# Patient Record
Sex: Female | Born: 1935 | Race: White | Hispanic: No | Marital: Married | Smoking: Never smoker
Health system: Southern US, Community
[De-identification: ages and names within clinical notes are randomized; demographics above are authoritative.]

## PROBLEM LIST (undated history)

## (undated) DIAGNOSIS — K2270 Barrett's esophagus without dysplasia: Secondary | ICD-10-CM

## (undated) DIAGNOSIS — E039 Hypothyroidism, unspecified: Secondary | ICD-10-CM

## (undated) DIAGNOSIS — J8410 Pulmonary fibrosis, unspecified: Secondary | ICD-10-CM

## (undated) DIAGNOSIS — J45909 Unspecified asthma, uncomplicated: Secondary | ICD-10-CM

## (undated) DIAGNOSIS — E785 Hyperlipidemia, unspecified: Secondary | ICD-10-CM

## (undated) DIAGNOSIS — K589 Irritable bowel syndrome without diarrhea: Secondary | ICD-10-CM

## (undated) HISTORY — PX: GASTROSTOMY W/ FEEDING TUBE: SUR642

## (undated) HISTORY — PX: HERNIA REPAIR: SHX51

## (undated) HISTORY — PX: NISSEN FUNDOPLICATION: SHX2091

---
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
## 1975-10-12 HISTORY — PX: ABDOMINAL HYSTERECTOMY: SHX81

## 2016-02-28 ENCOUNTER — Encounter (HOSPITAL_BASED_OUTPATIENT_CLINIC_OR_DEPARTMENT_OTHER): Payer: Self-pay | Admitting: Emergency Medicine

## 2016-02-28 ENCOUNTER — Emergency Department (HOSPITAL_BASED_OUTPATIENT_CLINIC_OR_DEPARTMENT_OTHER): Payer: Medicare Other

## 2016-02-28 ENCOUNTER — Emergency Department (HOSPITAL_BASED_OUTPATIENT_CLINIC_OR_DEPARTMENT_OTHER)
Admission: EM | Admit: 2016-02-28 | Discharge: 2016-02-28 | Disposition: A | Discharge status: Home / Self Care | Payer: Medicare Other | Attending: Emergency Medicine | Admitting: Emergency Medicine

## 2016-02-28 DIAGNOSIS — E039 Hypothyroidism, unspecified: Secondary | ICD-10-CM | POA: Insufficient documentation

## 2016-02-28 DIAGNOSIS — J45909 Unspecified asthma, uncomplicated: Secondary | ICD-10-CM | POA: Insufficient documentation

## 2016-02-28 DIAGNOSIS — K5901 Slow transit constipation: Secondary | ICD-10-CM | POA: Diagnosis not present

## 2016-02-28 DIAGNOSIS — E785 Hyperlipidemia, unspecified: Secondary | ICD-10-CM | POA: Insufficient documentation

## 2016-02-28 DIAGNOSIS — Z79899 Other long term (current) drug therapy: Secondary | ICD-10-CM | POA: Diagnosis not present

## 2016-02-28 DIAGNOSIS — K5900 Constipation, unspecified: Secondary | ICD-10-CM | POA: Diagnosis present

## 2016-02-28 HISTORY — DX: Irritable bowel syndrome without diarrhea: K58.9

## 2016-02-28 HISTORY — DX: Unspecified asthma, uncomplicated: J45.909

## 2016-02-28 HISTORY — DX: Hypothyroidism, unspecified: E03.9

## 2016-02-28 HISTORY — DX: Hyperlipidemia, unspecified: E78.5

## 2016-02-28 HISTORY — DX: Pulmonary fibrosis, unspecified: J84.10

## 2016-02-28 HISTORY — DX: Barrett's esophagus without dysplasia: K22.70

## 2016-02-28 LAB — OCCULT BLOOD X 1 CARD TO LAB, STOOL: FECAL OCCULT BLD: POSITIVE — AB

## 2016-02-28 MED ORDER — DICYCLOMINE HCL 20 MG PO TABS: 20.0000 mg | ORAL_TABLET | Freq: Two times a day (BID) | ORAL | Status: AC

## 2016-02-28 MED ORDER — DICYCLOMINE HCL 10 MG PO CAPS
20.0000 mg | ORAL_CAPSULE | Freq: Once | ORAL | Status: AC
  Administered 2016-02-28: 20 mg via ORAL
  Filled 2016-02-28: qty 2

## 2016-02-28 MED ORDER — LACTULOSE 10 GM/15ML PO SOLN
20.0000 g | Freq: Two times a day (BID) | ORAL | Status: AC | PRN
Start: 2016-02-28 — End: ?

## 2016-02-28 MED ORDER — ONDANSETRON 8 MG PO TBDP
8.0000 mg | ORAL_TABLET | Freq: Once | ORAL | Status: AC
  Administered 2016-02-28: 8 mg via ORAL
  Filled 2016-02-28: qty 1

## 2016-02-28 MED ORDER — MINERAL OIL RE ENEM
1.0000 | ENEMA | Freq: Once | RECTAL | Status: AC
  Administered 2016-02-28: 1 via RECTAL
  Filled 2016-02-28: qty 1

## 2016-02-28 MED ORDER — FLEET ENEMA 7-19 GM/118ML RE ENEM
1.0000 | ENEMA | Freq: Once | RECTAL | Status: AC
  Administered 2016-02-28: 1 via RECTAL
  Filled 2016-02-28: qty 1

## 2016-02-28 MED ORDER — PROCHLORPERAZINE EDISYLATE 5 MG/ML IJ SOLN
10.0000 mg | Freq: Once | INTRAMUSCULAR | Status: AC
  Administered 2016-02-28: 10 mg via INTRAMUSCULAR
  Filled 2016-02-28: qty 2

## 2016-02-28 NOTE — ED Notes (Signed)
Called pt for triage but she had to use the bathroom first.

## 2016-02-28 NOTE — ED Notes (Signed)
[facility] with low abd pain and cramping. Currently being treated for UTI

## 2016-02-28 NOTE — ED Notes (Signed)
Pt given d/c instructions as per chart. Verbalizes understanding. No questions. Rx x 2. 

## 2016-02-28 NOTE — ED Notes (Signed)
Pt vomiting and nauseated, MD notified. Unable to have BM

## 2016-02-28 NOTE — Discharge Instructions (Signed)
Constipation, Adult °Constipation is when a person: °· Poops (has a bowel movement) less than 3 times a week. °· Has a hard time pooping. °· Has poop that is dry, hard, or bigger than normal. °HOME CARE  °· Eat foods with a lot of fiber in them. This includes fruits, vegetables, beans, and whole grains such as brown rice. °· Avoid fatty foods and foods with a lot of sugar. This includes french fries, hamburgers, cookies, candy, and soda. °· If you are not getting enough fiber from food, take products with added fiber in them (supplements). °· Drink enough fluid to keep your pee (urine) clear or pale yellow. °· Exercise on a regular basis, or as told by your doctor. °· Go to the restroom when you feel like you need to poop. Do not hold it. °· Only take medicine as told by your doctor. Do not take medicines that help you poop (laxatives) without talking to your doctor first. °GET HELP RIGHT AWAY IF:  °· You have bright red blood in your poop (stool). °· Your constipation lasts more than 4 days or gets worse. °· You have belly (abdominal) or butt (rectal) pain. °· You have thin poop (as thin as a pencil). °· You lose weight, and it cannot be explained. °MAKE SURE YOU:  °· Understand these instructions. °· Will watch your condition. °· Will get help right away if you are not doing well or get worse. °  °This information is not intended to replace advice given to you by your health care provider. Make sure you discuss any questions you have with your health care provider. °  °Document Released: 03/15/2008 Document Revised: 10/18/2014 Document Reviewed: 07/09/2013 °Elsevier Interactive Patient Education ©2016 [employer] ° °High-Fiber Diet °Fiber, also called dietary fiber, is a type of carbohydrate found in fruits, vegetables, whole grains, and beans. A high-fiber diet can have many health benefits. Your health care provider may recommend a high-fiber diet to help: °· Prevent constipation. Fiber can make your bowel  movements more regular. °· Lower your cholesterol. °· Relieve hemorrhoids, uncomplicated diverticulosis, or irritable bowel syndrome. °· Prevent overeating as part of a weight-loss plan. °· Prevent heart disease, type 2 diabetes, and certain cancers. °WHAT IS MY PLAN? °The recommended daily intake of fiber includes: °· 38 grams for men under age 50. °· 30 grams for men over age 50. °· 25 grams for women under age 50. °· 21 grams for women over age 50. °You can get the recommended daily intake of dietary fiber by eating a variety of fruits, vegetables, grains, and beans. Your health care provider may also recommend a fiber supplement if it is not possible to get enough fiber through your diet. °WHAT DO I NEED TO KNOW ABOUT A HIGH-FIBER DIET? °· Fiber supplements have not been widely studied for their effectiveness, so it is better to get fiber through food sources. °· Always check the fiber content on the nutrition facts label of any prepackaged food. Look for foods that contain at least 5 grams of fiber per serving. °· Ask your dietitian if you have questions about specific foods that are related to your condition, especially if those foods are not listed in the following section. °· Increase your daily fiber consumption gradually. Increasing your intake of dietary fiber too quickly may cause bloating, cramping, or gas. °· Drink plenty of water. Water helps you to digest fiber. °WHAT FOODS CAN I EAT? °Grains °Whole-grain breads. Multigrain cereal. Oats and oatmeal. Brown rice. Barley.   Bulgur wheat. Millet. Bran muffins. Popcorn. Rye wafer crackers. °Vegetables °Sweet potatoes. Spinach. Kale. Artichokes. Cabbage. Broccoli. Green peas. Carrots. Squash. °Fruits °Berries. Pears. Apples. Oranges. Avocados. Prunes and raisins. Dried figs. °Meats and Other Protein Sources °Navy, kidney, pinto, and soy beans. Split peas. Lentils. Nuts and seeds. °Dairy °Fiber-fortified yogurt. °Beverages °Fiber-fortified soy milk.  Fiber-fortified orange juice. °Other °Fiber bars. °The items listed above may not be a complete list of recommended foods or beverages. Contact your dietitian for more options. °WHAT FOODS ARE NOT RECOMMENDED? °Grains °White bread. Pasta made with refined flour. White rice. °Vegetables °Fried potatoes. Canned vegetables. Well-cooked vegetables.  °Fruits °Fruit juice. Cooked, strained fruit. °Meats and Other Protein Sources °Fatty cuts of meat. Fried poultry or fried fish. °Dairy °Milk. Yogurt. Cream cheese. Sour cream. °Beverages °Soft drinks. °Other °Cakes and pastries. Butter and oils. °The items listed above may not be a complete list of foods and beverages to avoid. Contact your dietitian for more information. °WHAT ARE SOME TIPS FOR INCLUDING HIGH-FIBER FOODS IN MY DIET? °· Eat a wide variety of high-fiber foods. °· Make sure that half of all grains consumed each day are whole grains. °· Replace breads and cereals made from refined flour or white flour with whole-grain breads and cereals. °· Replace white rice with brown rice, bulgur wheat, or millet. °· Start the day with a breakfast that is high in fiber, such as a cereal that contains at least 5 grams of fiber per serving. °· Use beans in place of meat in soups, salads, or pasta. °· Eat high-fiber snacks, such as berries, raw vegetables, nuts, or popcorn. °  °This information is not intended to replace advice given to you by your health care provider. Make sure you discuss any questions you have with your health care provider. °  °Document Released: 09/27/2005 Document Revised: 10/18/2014 Document Reviewed: 03/12/2014 °Elsevier Interactive Patient Education ©2016 Elsevier Inc. ° °

## 2016-02-28 NOTE — ED Notes (Signed)
Pt up to bedside commode. Unable to have BM, c/o nausea, MD notified.

## 2016-02-28 NOTE — ED Notes (Signed)
Pt tolerated 2nd enema well. Instructed to try to hold it in for 15 min.

## 2016-02-28 NOTE — ED Notes (Signed)
Pt tolerated enema well. Bedside commode to bedside. Pt instructed to hold enema in for several minutes if possible.

## 2016-02-28 NOTE — ED Notes (Signed)
Pt states last BM 5/17. Has tried stool softeners and miralax without relief. Pt c/o lower abd pain.

## 2016-02-28 NOTE — ED Notes (Signed)
MD at bedside. 

## 2016-02-28 NOTE — ED Provider Notes (Signed)
CSN: [id number]     Arrival date & time 02/28/16  1555 History  By signing my name below, I, Linna DarnerRussell Turner, attest that this documentation has been prepared under the direction and in the presence of physician practitioner, Jacalyn LefevreJulie Shanaia Sievers, MD. Electronically Signed: Linna Darnerussell Turner, Scribe. 02/28/2016. 4:36 PM.   Chief Complaint  Patient presents with  . Constipation    The history is provided by the patient and a relative. No language interpreter was used.     HPI Comments: Haley Rangel is a 82 y.o. female with h/o IBS who presents to the Emergency Department complaining of constant constipation beginning three days ago. Pt reports associated abdominal pain since onset as well; she rates her abdominal pain as 2/10 currently. She states that she has felt the need to have a bowel movement since onset but has been unable to have one. Pt has no h/o abdominal surgery or impaction. She states that her last bowel movement was three days ago. Pt took an enema yesterday with no relief. Pt reports that she is currently being treated for a UTI and her medications were recently changed; she expresses concern that her medication change could be causing her symptoms. Pt denies dysuria, rectal pain, or any other associated symptoms.  Past Medical History  Diagnosis Date  . Asthma   . Barrett esophagus   . IBS (irritable bowel syndrome)   . Pulmonary fibrosis (HCC)   . HLD (hyperlipidemia)   . Hypothyroidism    Past Surgical History  Procedure Laterality Date  . Appendectomy  1953  . Abdominal hysterectomy  1977   History reviewed. No pertinent family history. Social History  Substance Use Topics  . Smoking status: Never Smoker   . Smokeless tobacco: None  . Alcohol Use: No   OB History    No data available     Review of Systems  Gastrointestinal: Positive for abdominal pain and constipation. Negative for rectal pain.  Genitourinary: Negative for dysuria.  All other systems reviewed and are  negative.   Allergies  Macrobid; Percocet; Pneumococcal vaccines; and Prednisone  Home Medications   Prior to Admission medications   Medication Sig Start Date End Date Taking? Authorizing Provider  calcium carbonate (OSCAL) 1500 (600 Ca) MG TABS tablet Take 600 mg of elemental calcium by mouth 2 (two) times daily with a meal.   Yes Historical Provider, MD  cholecalciferol (VITAMIN D) 1000 units tablet Take 1,000 Units by mouth daily.   Yes Historical Provider, MD  dicyclomine (BENTYL) 20 MG tablet Take 1 tablet (20 mg total) by mouth 2 (two) times daily. 02/28/16   Jacalyn LefevreJulie Timiko Offutt, MD  fexofenadine (ALLEGRA) 30 MG tablet Take 30 mg by mouth 2 (two) times daily.   Yes Historical Provider, MD  HYDROcodone-acetaminophen (NORCO/VICODIN) 5-325 MG tablet Take 1 tablet by mouth every 6 (six) hours as needed for moderate pain.   Yes Historical Provider, MD  lactulose (CHRONULAC) 10 GM/15ML solution Take 30 mLs (20 g total) by mouth 2 (two) times daily as needed for mild constipation or severe constipation. 02/28/16   Jacalyn LefevreJulie Mahmoud Blazejewski, MD  levothyroxine (SYNTHROID, LEVOTHROID) 112 MCG tablet Take 112 mcg by mouth daily before breakfast.   Yes Historical Provider, MD  omeprazole (PRILOSEC) 20 MG capsule Take 20 mg by mouth daily.   Yes Historical Provider, MD  sertraline (ZOLOFT) 100 MG tablet Take 100 mg by mouth daily.   Yes Historical Provider, MD  simvastatin (ZOCOR) 40 MG tablet Take 40 mg by mouth daily.  Yes Historical Provider, MD   BP 107/73 mmHg  Pulse 82  Temp(Src) 98.3 F (36.8 C) (Oral)  Resp 20  Ht  (1.549 m)  Wt 148 lb (67.132 kg)  BMI 27.98 kg/m2  SpO2 93% Physical Exam  Constitutional: She is oriented to person, place, and time. She appears well-developed and well-nourished. No distress.  HENT:  Head: Normocephalic and atraumatic.  Eyes: Conjunctivae and EOM are normal.  Neck: Neck supple. No tracheal deviation present.  Cardiovascular: Normal rate.   Pulmonary/Chest:  Effort normal. No respiratory distress.  Musculoskeletal: Normal range of motion.  Neurological: She is alert and oriented to person, place, and time.  Skin: Skin is warm and dry.  Psychiatric: She has a normal mood and affect. Her behavior is normal.  Nursing note and vitals reviewed.   ED Course  Procedures (including critical care time)  DIAGNOSTIC STUDIES: Oxygen Saturation is 99% on RA, normal by my interpretation.    COORDINATION OF CARE: 4:41 PM Discussed treatment plan with pt at bedside and pt agreed to plan.  Labs Review Labs Reviewed  OCCULT BLOOD X 1 CARD TO LAB, STOOL - Abnormal; Notable for the following:    Fecal Occult Bld POSITIVE (*)    All other components within normal limits    Imaging Review Dg Abd Acute W/chest  02/28/2016  CLINICAL DATA:  Constipation EXAM: DG ABDOMEN ACUTE W/ 1V CHEST COMPARISON:  None. FINDINGS: Chronic interstitial markings with subpleural reticulation/ fibrosis. No focal consolidation. No pleural effusion or pneumothorax. The heart is normal in size. Nonobstructive bowel gas pattern. Mild to moderate left colonic stool burden. Degenerative changes the visualized thoracolumbar spine with lumbar dextroscoliosis. IMPRESSION: No evidence of acute cardiopulmonary disease. No evidence of small bowel obstruction or free air. Mild to moderate left colonic stool burden. Electronically Signed   By: Charline Bills M.D.   On: 02/28/2016 17:53   I have personally reviewed and evaluated these images and lab results as part of my medical decision-making.   EKG Interpretation None      MDM  PT HAS HAD 3 ENEMAS WITH MINIMAL RESULT.  PT WILL BE D/C'D HOME WITH LACTULOSE.  IF THAT DOES NOT WORK, SHE IS INSTR TO RETURN HERE FOR [facility].  SHE WILL ALSO BE GIVEN BENTYL TO HELP WITH THE CRAMPING. Final diagnoses:  Slow transit constipation    I personally performed the services described in this documentation, which was scribed in my presence. The  recorded information has been reviewed and is accurate.   Jacalyn Lefevre, MD 02/28/16 2046

## 2016-02-28 NOTE — ED Notes (Signed)
Pt alert, NAD,calm, interactive, resps e/u, speaking in clear complete sentences, resps e/u, no dyspnea noted, "no results with enemas x3, reports some intestinal/ abd cramping, denies other sx at this time. [facility].

## 2016-02-29 ENCOUNTER — Encounter (HOSPITAL_BASED_OUTPATIENT_CLINIC_OR_DEPARTMENT_OTHER): Payer: Self-pay | Admitting: Emergency Medicine

## 2016-02-29 ENCOUNTER — Emergency Department (HOSPITAL_BASED_OUTPATIENT_CLINIC_OR_DEPARTMENT_OTHER)
Admission: EM | Admit: 2016-02-29 | Discharge: 2016-02-29 | Disposition: A | Discharge status: Home / Self Care | Payer: Medicare Other | Attending: Emergency Medicine | Admitting: Emergency Medicine

## 2016-02-29 ENCOUNTER — Emergency Department (HOSPITAL_BASED_OUTPATIENT_CLINIC_OR_DEPARTMENT_OTHER): Payer: Medicare Other

## 2016-02-29 DIAGNOSIS — R112 Nausea with vomiting, unspecified: Secondary | ICD-10-CM | POA: Insufficient documentation

## 2016-02-29 DIAGNOSIS — K5900 Constipation, unspecified: Secondary | ICD-10-CM | POA: Insufficient documentation

## 2016-02-29 DIAGNOSIS — E039 Hypothyroidism, unspecified: Secondary | ICD-10-CM | POA: Diagnosis not present

## 2016-02-29 DIAGNOSIS — Z90710 Acquired absence of both cervix and uterus: Secondary | ICD-10-CM | POA: Diagnosis not present

## 2016-02-29 DIAGNOSIS — J45909 Unspecified asthma, uncomplicated: Secondary | ICD-10-CM | POA: Insufficient documentation

## 2016-02-29 DIAGNOSIS — R109 Unspecified abdominal pain: Secondary | ICD-10-CM | POA: Diagnosis present

## 2016-02-29 DIAGNOSIS — E785 Hyperlipidemia, unspecified: Secondary | ICD-10-CM | POA: Diagnosis not present

## 2016-02-29 LAB — URINALYSIS, ROUTINE W REFLEX MICROSCOPIC
BILIRUBIN URINE: NEGATIVE
Glucose, UA: NEGATIVE mg/dL
HGB URINE DIPSTICK: NEGATIVE
KETONES UR: 15 mg/dL — AB
Leukocytes, UA: NEGATIVE
Nitrite: POSITIVE — AB
PROTEIN: NEGATIVE mg/dL
Specific Gravity, Urine: >1.046 — ABNORMAL HIGH (ref 1.005–1.030)
pH: 6.5 (ref 5.0–8.0)

## 2016-02-29 LAB — COMPREHENSIVE METABOLIC PANEL
ALK PHOS: 67 U/L (ref 38–126)
ALT: 18 U/L (ref 14–54)
AST: 29 U/L (ref 15–41)
Albumin: 4.2 g/dL (ref 3.5–5.0)
Anion gap: 11 (ref 5–15)
BUN: 11 mg/dL (ref 6–20)
CALCIUM: 9.4 mg/dL (ref 8.9–10.3)
CHLORIDE: 102 mmol/L (ref 101–111)
CO2: 24 mmol/L (ref 22–32)
CREATININE: 0.72 mg/dL (ref 0.44–1.00)
Glucose, Bld: 157 mg/dL — ABNORMAL HIGH (ref 65–99)
Potassium: 3.2 mmol/L — ABNORMAL LOW (ref 3.5–5.1)
Sodium: 137 mmol/L (ref 135–145)
TOTAL PROTEIN: 7.7 g/dL (ref 6.5–8.1)
Total Bilirubin: 0.9 mg/dL (ref 0.3–1.2)

## 2016-02-29 LAB — I-STAT CG4 LACTIC ACID, ED: LACTIC ACID, VENOUS: 1.47 mmol/L (ref 0.5–2.0)

## 2016-02-29 LAB — CBC WITH DIFFERENTIAL/PLATELET
Basophils Absolute: 0 10*3/uL (ref 0.0–0.1)
Basophils Relative: 0 %
EOS PCT: 0 %
Eosinophils Absolute: 0 10*3/uL (ref 0.0–0.7)
HCT: 35.8 % — ABNORMAL LOW (ref 36.0–46.0)
Hemoglobin: 11.8 g/dL — ABNORMAL LOW (ref 12.0–15.0)
LYMPHS ABS: 0.7 10*3/uL (ref 0.7–4.0)
LYMPHS PCT: 6 %
MCH: 28.2 pg (ref 26.0–34.0)
MCHC: 33 g/dL (ref 30.0–36.0)
MCV: 85.4 fL (ref 78.0–100.0)
Monocytes Absolute: 0.3 10*3/uL (ref 0.1–1.0)
Monocytes Relative: 3 %
Neutro Abs: 9.4 10*3/uL — ABNORMAL HIGH (ref 1.7–7.7)
Neutrophils Relative %: 91 %
PLATELETS: 231 10*3/uL (ref 150–400)
RBC: 4.19 MIL/uL (ref 3.87–5.11)
RDW: 14.2 % (ref 11.5–15.5)
WBC: 10.4 10*3/uL (ref 4.0–10.5)

## 2016-02-29 LAB — URINE MICROSCOPIC-ADD ON

## 2016-02-29 LAB — LIPASE, BLOOD: LIPASE: 18 U/L (ref 11–51)

## 2016-02-29 MED ORDER — IOPAMIDOL (ISOVUE-300) INJECTION 61%
100.0000 mL | Freq: Once | INTRAVENOUS | Status: AC | PRN
  Administered 2016-02-29: 100 mL via INTRAVENOUS

## 2016-02-29 MED ORDER — SODIUM CHLORIDE 0.9 % IV BOLUS (SEPSIS)
500.0000 mL | Freq: Once | INTRAVENOUS | Status: AC
  Administered 2016-02-29: 500 mL via INTRAVENOUS

## 2016-02-29 MED ORDER — ONDANSETRON HCL 4 MG/2ML IJ SOLN
4.0000 mg | Freq: Once | INTRAMUSCULAR | Status: AC | PRN
  Administered 2016-02-29: 4 mg via INTRAVENOUS

## 2016-02-29 MED ORDER — ONDANSETRON HCL 4 MG/2ML IJ SOLN
4.0000 mg | Freq: Once | INTRAMUSCULAR | Status: AC
  Administered 2016-02-29: 4 mg via INTRAVENOUS
  Filled 2016-02-29: qty 2

## 2016-02-29 MED ORDER — DIPHENHYDRAMINE HCL 50 MG/ML IJ SOLN
12.5000 mg | Freq: Once | INTRAMUSCULAR | Status: AC
  Administered 2016-02-29: 12.5 mg via INTRAVENOUS
  Filled 2016-02-29: qty 1

## 2016-02-29 MED ORDER — SODIUM CHLORIDE 0.9 % IV SOLN
Freq: Once | INTRAVENOUS | Status: AC
  Administered 2016-02-29: 09:00:00 via INTRAVENOUS

## 2016-02-29 MED ORDER — ONDANSETRON HCL 4 MG/2ML IJ SOLN
INTRAMUSCULAR | Status: AC
  Filled 2016-02-29: qty 2

## 2016-02-29 MED ORDER — PROCHLORPERAZINE EDISYLATE 5 MG/ML IJ SOLN
5.0000 mg | Freq: Once | INTRAMUSCULAR | Status: AC
  Administered 2016-02-29: 5 mg via INTRAVENOUS
  Filled 2016-02-29: qty 2

## 2016-02-29 NOTE — ED Notes (Addendum)
Pt tx here yesterday for constipation. Sent home with bentyl and lactulose. Pt in today with ongoing lower abd pain with N/V. Pt reports very small, hard BM last night.

## 2016-02-29 NOTE — ED Notes (Signed)
MD at bedside. 

## 2016-02-29 NOTE — ED Notes (Signed)
Pt actively vomiting during assessment

## 2016-02-29 NOTE — ED Provider Notes (Addendum)
CSN: [id number]     Arrival date & time 02/29/16  [id number] History   First MD Initiated Contact with Patient 02/29/16 [phone]     Chief Complaint  Patient presents with  . Abdominal Pain     HPI  Patient presents for evaluation of abdominal pain. Seen here yesterday with a complaint of constipation and reported history of irritable bowel syndrome. States she occasionally will have hard stools but has not been plagued with constipation as a significant symptom for her in the past. Had been 2 days since a bowel movement yesterday. Was given lactulose and Bentyl. Had a small bowel movement last night no relief awakened this morning with continuing pain and now some nausea and vomiting. Crampy and intermittent symptoms. No fevers or chills. No blood. No dysuria frequency. Past surgical history includes appendectomy, total abdominal hysterectomy.  Patient, and daughter report normal colonoscopy in the last 10 years  Past Medical History  Diagnosis Date  . Asthma   . Barrett esophagus   . IBS (irritable bowel syndrome)   . Pulmonary fibrosis (HCC)   . HLD (hyperlipidemia)   . Hypothyroidism    Past Surgical History  Procedure Laterality Date  . Appendectomy  1953  . Abdominal hysterectomy  1977   No family history on file. Social History  Substance Use Topics  . Smoking status: Never Smoker   . Smokeless tobacco: None  . Alcohol Use: No   OB History    No data available     Review of Systems  Constitutional: Negative for fever, chills, diaphoresis, appetite change and fatigue.  HENT: Negative for mouth sores, sore throat and trouble swallowing.   Eyes: Negative for visual disturbance.  Respiratory: Negative for cough, chest tightness, shortness of breath and wheezing.   Cardiovascular: Negative for chest pain.  Gastrointestinal: Positive for nausea, vomiting, abdominal pain, constipation and abdominal distention. Negative for diarrhea.  Endocrine: Negative for polydipsia, polyphagia  and polyuria.  Genitourinary: Negative for dysuria, frequency and hematuria.  Musculoskeletal: Negative for gait problem.  Skin: Negative for color change, pallor and rash.  Neurological: Negative for dizziness, syncope, light-headedness and headaches.  Hematological: Does not bruise/bleed easily.  Psychiatric/Behavioral: Negative for behavioral problems and confusion.      Allergies  Macrobid; Percocet; Pneumococcal vaccines; and Prednisone  Home Medications   Prior to Admission medications   Medication Sig Start Date End Date Taking? Authorizing Provider  calcium carbonate (OSCAL) 1500 (600 Ca) MG TABS tablet Take 600 mg of elemental calcium by mouth 2 (two) times daily with a meal.    Historical Provider, MD  cholecalciferol (VITAMIN D) 1000 units tablet Take 1,000 Units by mouth daily.    Historical Provider, MD  dicyclomine (BENTYL) 20 MG tablet Take 1 tablet (20 mg total) by mouth 2 (two) times daily. 02/28/16   Jacalyn LefevreJulie Haviland, MD  fexofenadine (ALLEGRA) 30 MG tablet Take 30 mg by mouth 2 (two) times daily.    Historical Provider, MD  HYDROcodone-acetaminophen (NORCO/VICODIN) 5-325 MG tablet Take 1 tablet by mouth every 6 (six) hours as needed for moderate pain.    Historical Provider, MD  lactulose (CHRONULAC) 10 GM/15ML solution Take 30 mLs (20 g total) by mouth 2 (two) times daily as needed for mild constipation or severe constipation. 02/28/16   Jacalyn LefevreJulie Haviland, MD  levothyroxine (SYNTHROID, LEVOTHROID) 112 MCG tablet Take 112 mcg by mouth daily before breakfast.    Historical Provider, MD  omeprazole (PRILOSEC) 20 MG capsule Take 20 mg by mouth daily.  Historical Provider, MD  sertraline (ZOLOFT) 100 MG tablet Take 100 mg by mouth daily.    Historical Provider, MD  simvastatin (ZOCOR) 40 MG tablet Take 40 mg by mouth daily.    Historical Provider, MD   BP 149/80 mmHg  Pulse 83  Temp(Src) 97.9 F (36.6 C) (Oral)  Resp 17  SpO2 98% Physical Exam  Constitutional: She is  oriented to person, place, and time. She appears well-developed and well-nourished. No distress.  A elderly female. Awake. Quietly moaning. Holds her hand across her bilateral lower abdomen.  HENT:  Head: Normocephalic.  Eyes: Conjunctivae are normal. Pupils are equal, round, and reactive to light. No scleral icterus.  Neck: Normal range of motion. Neck supple. No thyromegaly present.  Cardiovascular: Normal rate and regular rhythm.  Exam reveals no gallop and no friction rub.   No murmur heard. Pulmonary/Chest: Effort normal and breath sounds normal. No respiratory distress. She has no wheezes. She has no rales.  Abdominal: Soft. Bowel sounds are normal. She exhibits no distension. There is no tenderness. There is no rebound.    Musculoskeletal: Normal range of motion.  Neurological: She is alert and oriented to person, place, and time.  Skin: Skin is warm and dry. No rash noted.  Psychiatric: She has a normal mood and affect. Her behavior is normal.    ED Course  Procedures (including critical care time) Labs Review Labs Reviewed  CBC WITH DIFFERENTIAL/PLATELET - Abnormal; Notable for the following:    Hemoglobin 11.8 (*)    HCT 35.8 (*)    Neutro Abs 9.4 (*)    All other components within normal limits  COMPREHENSIVE METABOLIC PANEL - Abnormal; Notable for the following:    Potassium 3.2 (*)    Glucose, Bld 157 (*)    All other components within normal limits  URINALYSIS, ROUTINE W REFLEX MICROSCOPIC (NOT AT [facility]) - Abnormal; Notable for the following:    Color, Urine ORANGE (*)    Specific Gravity, Urine >1.046 (*)    Ketones, ur 15 (*)    Nitrite POSITIVE (*)    All other components within normal limits  URINE MICROSCOPIC-ADD ON - Abnormal; Notable for the following:    Squamous Epithelial / LPF 0-5 (*)    Bacteria, UA RARE (*)    All other components within normal limits  LIPASE, BLOOD  I-STAT CG4 LACTIC ACID, ED    Imaging Review Ct Abdomen Pelvis W  Contrast  02/29/2016  CLINICAL DATA:  Abdominal pain. Constipation. Nausea and vomiting. History of appendectomy and hysterectomy. EXAM: CT ABDOMEN AND PELVIS WITH CONTRAST TECHNIQUE: Multidetector CT imaging of the abdomen and pelvis was performed using the standard protocol following bolus administration of intravenous contrast. CONTRAST:  ISOVUE-300 IOPAMIDOL (ISOVUE-300) INJECTION 61% COMPARISON:  Abdominal radiographs from 1 day prior. FINDINGS: Lower chest: Patchy subpleural reticulation at both lung bases. Coronary atherosclerosis. Hepatobiliary: Normal liver size. There are 3 scattered subcentimeter hypodense lesions throughout the liver, too small to characterize. Layering 6 mm calcified gallstone in the gallbladder, with no gallbladder wall thickening or pericholecystic fluid. No biliary ductal dilatation. Pancreas: Normal, with no mass or duct dilation. Spleen: Normal size. No mass. Adrenals/Urinary Tract: Normal adrenals. No hydronephrosis. Small simple parapelvic renal cysts in the left renal sinus. There a few tiny subcentimeter hypodense renal cortical lesions in both kidneys, too small to characterize. Normal bladder. Stomach/Bowel: Moderate to large hiatal hernia. No definite gastric wall thickening or significant gastric distention. Normal caliber small bowel with no small  bowel wall thickening. Appendectomy. There is moderate sigmoid diverticulosis. There is uniform mild wall thickening throughout the sigmoid colon. No significant pericolonic fat stranding. There is mild-to-moderate stool in the distal colon with liquid stool and fluid levels throughout the mildly distended proximal colon. Vascular/Lymphatic: Atherosclerotic nonaneurysmal abdominal aorta. Patent portal, splenic, hepatic and renal veins. No pathologically enlarged lymph nodes in the abdomen or pelvis. Reproductive: Status post hysterectomy, with no abnormal findings at the vaginal cuff. No adnexal mass. Other: No  pneumoperitoneum, ascites or focal fluid collection. Musculoskeletal: No aggressive appearing focal osseous lesions. Moderate degenerative changes in the visualized thoracolumbar spine. Small fat containing right inguinal hernia. IMPRESSION: 1. Moderate sigmoid diverticulosis. Uniform wall thickening throughout the sigmoid colon without significant pericolonic fat stranding, favor muscular hypertrophy due to chronic diverticulosis. 2. Mild to moderate stool in the distal colon with liquid stool and fluid levels in the mildly distended proximal colon. No bowel obstruction. 3. Moderate to large hiatal hernia. 4. Nonspecific patchy subpleural reticulation at both lung bases, cannot exclude an interstitial lung disease. 5. Cholelithiasis. 6. Coronary atherosclerosis. Electronically Signed   By: Delbert Phenix M.D.   On: 02/29/2016 10:26   Dg Abd Acute W/chest  02/28/2016  CLINICAL DATA:  Constipation EXAM: DG ABDOMEN ACUTE W/ 1V CHEST COMPARISON:  None. FINDINGS: Chronic interstitial markings with subpleural reticulation/ fibrosis. No focal consolidation. No pleural effusion or pneumothorax. The heart is normal in size. Nonobstructive bowel gas pattern. Mild to moderate left colonic stool burden. Degenerative changes the visualized thoracolumbar spine with lumbar dextroscoliosis. IMPRESSION: No evidence of acute cardiopulmonary disease. No evidence of small bowel obstruction or free air. Mild to moderate left colonic stool burden. Electronically Signed   By: Charline Bills M.D.   On: 02/28/2016 17:53   I have personally reviewed and evaluated these images and lab results as part of my medical decision-making.   EKG Interpretation None      MDM   Final diagnoses:  Constipation, unspecified constipation type    CT reassuring. Some thickening of the sigmoid colon. Diverticuli. No signs of diverticulitis. Not febrile. Given soapsuds enema and had production of stool her symptoms have resolved. She's  taking by mouth. She is not nauseated. No pain. Abdomen benign. Plan is home, continue lactulose, liquids until producing more regular stool. ER with any changes.    Rolland Porter, MD 02/29/16 1224  Rolland Porter, MD 02/29/16 [phone]

## 2016-02-29 NOTE — ED Notes (Signed)
19:29- pt daughter called reports Ms. Haley Rangel has oral temp of 99.8.  Pt is still nauseated. Advised pt to treat with tylenol and continue to monitor and return or follow up with PCP  if fever gets above 100.5.

## 2016-02-29 NOTE — Discharge Instructions (Signed)
Liquids only until having more frequent bowel movements. Lactulose as prescribed until having more frequent bowel movements. Return to ER with return of vomiting, fever, bloody stools, worsening or change in abdominal pain.   Constipation, Adult Constipation is when a person has fewer than three bowel movements a week, has difficulty having a bowel movement, or has stools that are dry, hard, or larger than normal. As people grow older, constipation is more common. A low-fiber diet, not taking in enough fluids, and taking certain medicines may make constipation worse.  CAUSES   Certain medicines, such as antidepressants, pain medicine, iron supplements, antacids, and water pills.   Certain diseases, such as diabetes, irritable bowel syndrome (IBS), thyroid disease, or depression.   Not drinking enough water.   Not eating enough fiber-rich foods.   Stress or travel.   Lack of physical activity or exercise.   Ignoring the urge to have a bowel movement.   Using laxatives too much.  SIGNS AND SYMPTOMS   Having fewer than three bowel movements a week.   Straining to have a bowel movement.   Having stools that are hard, dry, or larger than normal.   Feeling full or bloated.   Pain in the lower abdomen.   Not feeling relief after having a bowel movement.  DIAGNOSIS  Your health care provider will take a medical history and perform a physical exam. Further testing may be done for severe constipation. Some tests may include:  A barium enema X-ray to examine your rectum, colon, and, sometimes, your small intestine.   A sigmoidoscopy to examine your lower colon.   A colonoscopy to examine your entire colon. TREATMENT  Treatment will depend on the severity of your constipation and what is causing it. Some dietary treatments include drinking more fluids and eating more fiber-rich foods. Lifestyle treatments may include regular exercise. If these diet and lifestyle  recommendations do not help, your health care provider may recommend taking over-the-counter laxative medicines to help you have bowel movements. Prescription medicines may be prescribed if over-the-counter medicines do not work.  HOME CARE INSTRUCTIONS   Eat foods that have a lot of fiber, such as fruits, vegetables, whole grains, and beans.  Limit foods high in fat and processed sugars, such as french fries, hamburgers, cookies, candies, and soda.   A fiber supplement may be added to your diet if you cannot get enough fiber from foods.   Drink enough fluids to keep your urine clear or pale yellow.   Exercise regularly or as directed by your health care provider.   Go to the restroom when you have the urge to go. Do not hold it.   Only take over-the-counter or prescription medicines as directed by your health care provider. Do not take other medicines for constipation without talking to your health care provider first.  SEEK IMMEDIATE MEDICAL CARE IF:   You have bright red blood in your stool.   Your constipation lasts for more than 4 days or gets worse.   You have abdominal or rectal pain.   You have thin, pencil-like stools.   You have unexplained weight loss. MAKE SURE YOU:   Understand these instructions.  Will watch your condition.  Will get help right away if you are not doing well or get worse.   This information is not intended to replace advice given to you by your health care provider. Make sure you discuss any questions you have with your health care provider.   Document Released:  06/25/2004 Document Revised: 10/18/2014 Document Reviewed: 07/09/2013 Elsevier Interactive Patient Education [employer].

## 2016-02-29 NOTE — ED Notes (Signed)
Pt o2 sats 87% on room air. Pt is very relaxed and resting. Pt placed on o2 2lpm

## 2016-08-21 ENCOUNTER — Encounter (HOSPITAL_BASED_OUTPATIENT_CLINIC_OR_DEPARTMENT_OTHER): Payer: Self-pay

## 2016-08-21 ENCOUNTER — Emergency Department (HOSPITAL_BASED_OUTPATIENT_CLINIC_OR_DEPARTMENT_OTHER)
Admission: EM | Admit: 2016-08-21 | Discharge: 2016-08-21 | Disposition: A | Discharge status: Home / Self Care | Payer: Medicare Other | Attending: Emergency Medicine | Admitting: Emergency Medicine

## 2016-08-21 DIAGNOSIS — E039 Hypothyroidism, unspecified: Secondary | ICD-10-CM | POA: Insufficient documentation

## 2016-08-21 DIAGNOSIS — J45909 Unspecified asthma, uncomplicated: Secondary | ICD-10-CM | POA: Diagnosis not present

## 2016-08-21 DIAGNOSIS — Z79899 Other long term (current) drug therapy: Secondary | ICD-10-CM | POA: Insufficient documentation

## 2016-08-21 DIAGNOSIS — R300 Dysuria: Secondary | ICD-10-CM | POA: Diagnosis present

## 2016-08-21 DIAGNOSIS — N390 Urinary tract infection, site not specified: Secondary | ICD-10-CM | POA: Diagnosis not present

## 2016-08-21 LAB — URINALYSIS, ROUTINE W REFLEX MICROSCOPIC
Bilirubin Urine: NEGATIVE
Glucose, UA: NEGATIVE mg/dL
Ketones, ur: 15 mg/dL — AB
NITRITE: POSITIVE — AB
PROTEIN: 30 mg/dL — AB
SPECIFIC GRAVITY, URINE: 1.015 (ref 1.005–1.030)
pH: 6.5 (ref 5.0–8.0)

## 2016-08-21 LAB — URINE MICROSCOPIC-ADD ON

## 2016-08-21 MED ORDER — AMOXICILLIN-POT CLAVULANATE 875-125 MG PO TABS
1.0000 | ORAL_TABLET | Freq: Once | ORAL | Status: AC
  Administered 2016-08-21: 1 via ORAL
  Filled 2016-08-21: qty 1

## 2016-08-21 MED ORDER — AMOXICILLIN-POT CLAVULANATE 875-125 MG PO TABS: 1.0000 | ORAL_TABLET | Freq: Two times a day (BID) | ORAL | 0 refills | Status: AC

## 2016-08-21 MED ORDER — IBUPROFEN 800 MG PO TABS
800.0000 mg | ORAL_TABLET | Freq: Once | ORAL | Status: AC
  Administered 2016-08-21: 800 mg via ORAL
  Filled 2016-08-21: qty 1

## 2016-08-21 MED ORDER — PHENAZOPYRIDINE HCL 200 MG PO TABS: 200.0000 mg | ORAL_TABLET | Freq: Three times a day (TID) | ORAL | 0 refills | Status: AC

## 2016-08-21 NOTE — ED Notes (Signed)
ED Provider at bedside. 

## 2016-08-21 NOTE — ED Triage Notes (Signed)
Pt states she woke at 0330 this morning and had an incredible urgency to go to the restroom and felt right away that she was getting a UTI again.  Pt just finished a round of abx on Monday and was symptom free until this morning.  Pt took pyridium prior to coming in and states it is starting to help a little.

## 2016-08-21 NOTE — ED Notes (Signed)
Pt and daughter verbalize understanding of dc instructions and deny any further needs at this time 

## 2016-08-21 NOTE — ED Provider Notes (Signed)
[facility]   CSN: [id number] Arrival date & time: 08/21/16  0445     History   Chief Complaint Chief Complaint  Patient presents with  . Dysuria    HPI Haley Rangel is a 82 y.o. female.  The history is provided by the patient.  Dysuria   This is a recurrent problem. The current episode started less than 1 hour ago. The problem occurs every urination. The problem has not changed since onset.The quality of the pain is described as burning. The pain is severe. There has been no fever. She is not sexually active. There is no history of pyelonephritis. Associated symptoms include frequency and urgency. Pertinent negatives include no hematuria. She has tried home medications for the symptoms. Her past medical history is significant for recurrent UTIs.  has a MDR UTI and was symptom free on Augmentin and stopped it on Monday today symptoms returned  Past Medical History:  Diagnosis Date  . Asthma   . Barrett esophagus   . HLD (hyperlipidemia)   . Hypothyroidism   . IBS (irritable bowel syndrome)   . Pulmonary fibrosis (HCC)     There are no active problems to display for this patient.   Past Surgical History:  Procedure Laterality Date  . ABDOMINAL HYSTERECTOMY  1977  . APPENDECTOMY  1953    OB History    No data available       Home Medications    Prior to Admission medications   Medication Sig Start Date End Date Taking? Authorizing Provider  amoxicillin-clavulanate (AUGMENTIN) 875-125 MG tablet Take 1 tablet by mouth 2 (two) times daily. One po bid x 14 days 08/21/16   Kylin Dubs, MD  calcium carbonate (OSCAL) 1500 (600 Ca) MG TABS tablet Take 600 mg of elemental calcium by mouth 2 (two) times daily with a meal.    Historical Provider, MD  cholecalciferol (VITAMIN D) 1000 units tablet Take 1,000 Units by mouth daily.    Historical Provider, MD  dicyclomine (BENTYL) 20 MG tablet Take 1 tablet (20 mg total) by mouth 2 (two) times daily.  02/28/16   Jacalyn Lefevre, MD  fexofenadine (ALLEGRA) 30 MG tablet Take 30 mg by mouth 2 (two) times daily.    Historical Provider, MD  HYDROcodone-acetaminophen (NORCO/VICODIN) 5-325 MG tablet Take 1 tablet by mouth every 6 (six) hours as needed for moderate pain.    Historical Provider, MD  lactulose (CHRONULAC) 10 GM/15ML solution Take 30 mLs (20 g total) by mouth 2 (two) times daily as needed for mild constipation or severe constipation. 02/28/16   Jacalyn Lefevre, MD  levothyroxine (SYNTHROID, LEVOTHROID) 112 MCG tablet Take 112 mcg by mouth daily before breakfast.    Historical Provider, MD  omeprazole (PRILOSEC) 20 MG capsule Take 20 mg by mouth daily.    Historical Provider, MD  phenazopyridine (PYRIDIUM) 200 MG tablet Take 1 tablet (200 mg total) by mouth 3 (three) times daily. 08/21/16   Bryna Razavi, MD  sertraline (ZOLOFT) 100 MG tablet Take 100 mg by mouth daily.    Historical Provider, MD  simvastatin (ZOCOR) 40 MG tablet Take 40 mg by mouth daily.    Historical Provider, MD    Family History No family history on file.  Social History Social History  Substance Use Topics  . Smoking status: Never Smoker  . Smokeless tobacco: Not on file  . Alcohol use No     Allergies   Macrobid [nitrofurantoin]; Percocet [oxycodone-acetaminophen]; Pneumococcal vaccines; and Prednisone  Review of Systems Review of Systems  Genitourinary: Positive for dysuria, frequency and urgency. Negative for hematuria.  All other systems reviewed and are negative.    Physical Exam Updated Vital Signs BP 149/76 (BP Location: Left Arm)   Pulse 82   Temp 98 F (36.7 C) (Oral)   Resp 16   Ht 5\' 1"  (1.549 m)   Wt 150 lb (68 kg)   SpO2 95%   BMI 28.34 kg/m   Physical Exam  Constitutional: She is oriented to person, place, and time. She appears well-developed and well-nourished.  HENT:  Head: Normocephalic and atraumatic.  Mouth/Throat: No oropharyngeal exudate.  Eyes: Conjunctivae are  normal. Pupils are equal, round, and reactive to light.  Neck: Normal range of motion. Neck supple.  Cardiovascular: Normal rate, regular rhythm and intact distal pulses.   Pulmonary/Chest: Effort normal and breath sounds normal. She has no wheezes. She has no rales.  Abdominal: Soft. Bowel sounds are normal. She exhibits no mass. There is no tenderness. There is no rebound and no guarding.  Musculoskeletal: Normal range of motion.  Neurological: She is alert and oriented to person, place, and time.  Skin: Skin is warm and dry. Capillary refill takes less than 2 seconds.  Psychiatric: She has a normal mood and affect.  Nursing note and vitals reviewed.    ED Treatments / Results   Vitals:   08/21/16 0449  BP: 149/76  Pulse: 82  Resp: 16  Temp: 98 F (36.7 C)    Labs (all labs ordered are listed, but only abnormal results are displayed) Labs Reviewed  URINALYSIS, ROUTINE W REFLEX MICROSCOPIC (NOT AT [facility]) - Abnormal; Notable for the following:       Result Value   Color, Urine ORANGE (*)    APPearance CLOUDY (*)    Hgb urine dipstick MODERATE (*)    Ketones, ur 15 (*)    Protein, ur 30 (*)    Nitrite POSITIVE (*)    Leukocytes, UA LARGE (*)    All other components within normal limits  URINE MICROSCOPIC-ADD ON - Abnormal; Notable for the following:    Squamous Epithelial / LPF 0-5 (*)    Bacteria, UA MANY (*)    All other components within normal limits  URINE CULTURE    E Procedures Procedures (including critical care time)  Medications Ordered in ED Medications  amoxicillin-clavulanate (AUGMENTIN) 875-125 MG per tablet 1 tablet (1 tablet Oral Given 08/21/16 0548)  ibuprofen (ADVIL,MOTRIN) tablet 800 mg (800 mg Oral Given 08/21/16 0548)     Final Clinical Impressions(s) / ED Diagnoses   Final diagnoses:  Lower urinary tract infectious disease  Given recent cultures, will restart Augmentin.  Will need to see her PMD Monday for recheck.  Follow up with urology  as notes state that in May they had planned to do a cystoscopy if she had another UTi and she has had several since.  Return for fevers, weakness, intractable symptoms, vomiting, diarrhea or any concerns.  Any of these symptoms will require admission.   Wipe front to back, use baby wipes. She is safe for discharge. All questions answered to patient's satisfaction. Based on history and exam patient has been appropriately medically screened and emergency conditions excluded. Patient is stable for discharge at this time. Follow up with your PMD for recheck in 2 days and strict return precautions given.   New Prescriptions New Prescriptions   AMOXICILLIN-CLAVULANATE (AUGMENTIN) 875-125 MG TABLET    Take 1 tablet by mouth 2 (  two) times daily. One po bid x 14 days   PHENAZOPYRIDINE (PYRIDIUM) 200 MG TABLET    Take 1 tablet (200 mg total) by mouth 3 (three) times daily.     Jonatan Wilsey, MD 08/21/16 0600

## 2016-08-24 LAB — URINE CULTURE: Special Requests: NORMAL

## 2016-08-25 ENCOUNTER — Telehealth (HOSPITAL_BASED_OUTPATIENT_CLINIC_OR_DEPARTMENT_OTHER): Payer: Self-pay | Admitting: Emergency Medicine

## 2016-08-25 NOTE — Telephone Encounter (Signed)
Post ED Visit - Positive Culture Follow-up  Culture report reviewed by antimicrobial stewardship pharmacist:  []  Haley Rangel, Pharm.D. []  Haley Rangel, Pharm.D., BCPS []  Haley Rangel, Pharm.D. []  Haley Rangel, Pharm.D., BCPS []  Haley Rangel, [place]., BCPS, AAHIVP []  Haley Rangel, Pharm.D., BCPS, AAHIVP []  Haley Rangel, Pharm.D. []  Haley Rangel, Haley Rangel  Positive urine culture Treated with augmentin, organism sensitive to the same and no further patient follow-up is required at this time.  Haley Rangel, Haley Rangel 08/25/2016, 10:37 AM

## 2017-06-05 ENCOUNTER — Emergency Department (HOSPITAL_BASED_OUTPATIENT_CLINIC_OR_DEPARTMENT_OTHER)
Admission: EM | Admit: 2017-06-05 | Discharge: 2017-06-06 | Disposition: A | Discharge status: Home / Self Care | Payer: Medicare Other | Attending: Emergency Medicine | Admitting: Emergency Medicine

## 2017-06-05 ENCOUNTER — Encounter (HOSPITAL_BASED_OUTPATIENT_CLINIC_OR_DEPARTMENT_OTHER): Payer: Self-pay | Admitting: Emergency Medicine

## 2017-06-05 DIAGNOSIS — R112 Nausea with vomiting, unspecified: Secondary | ICD-10-CM | POA: Insufficient documentation

## 2017-06-05 DIAGNOSIS — E039 Hypothyroidism, unspecified: Secondary | ICD-10-CM | POA: Diagnosis not present

## 2017-06-05 DIAGNOSIS — Z79899 Other long term (current) drug therapy: Secondary | ICD-10-CM | POA: Insufficient documentation

## 2017-06-05 DIAGNOSIS — Z9889 Other specified postprocedural states: Secondary | ICD-10-CM | POA: Insufficient documentation

## 2017-06-05 NOTE — ED Triage Notes (Signed)
Patient had a Nissanfundoplication in July for a hernia. The patient has been hospitalized 2 more times since then with N/V and complications. Patient was released from [facility] on Thursday and daughter states that she has been throwing up since.

## 2017-06-06 DIAGNOSIS — R112 Nausea with vomiting, unspecified: Secondary | ICD-10-CM | POA: Diagnosis not present

## 2017-06-06 LAB — CBC WITH DIFFERENTIAL/PLATELET
BASOS ABS: 0 10*3/uL (ref 0.0–0.1)
BASOS PCT: 0 %
EOS PCT: 1 %
Eosinophils Absolute: 0.1 10*3/uL (ref 0.0–0.7)
HEMATOCRIT: 33.8 % — AB (ref 36.0–46.0)
Hemoglobin: 11 g/dL — ABNORMAL LOW (ref 12.0–15.0)
Lymphocytes Relative: 10 %
Lymphs Abs: 0.7 10*3/uL (ref 0.7–4.0)
MCH: 29 pg (ref 26.0–34.0)
MCHC: 32.5 g/dL (ref 30.0–36.0)
MCV: 89.2 fL (ref 78.0–100.0)
MONO ABS: 0.3 10*3/uL (ref 0.1–1.0)
MONOS PCT: 3 %
NEUTROS ABS: 6.3 10*3/uL (ref 1.7–7.7)
Neutrophils Relative %: 86 %
PLATELETS: 257 10*3/uL (ref 150–400)
RBC: 3.79 MIL/uL — ABNORMAL LOW (ref 3.87–5.11)
RDW: 15.5 % (ref 11.5–15.5)
WBC: 7.4 10*3/uL (ref 4.0–10.5)

## 2017-06-06 LAB — COMPREHENSIVE METABOLIC PANEL
ALBUMIN: 3.9 g/dL (ref 3.5–5.0)
ALK PHOS: 89 U/L (ref 38–126)
ALT: 16 U/L (ref 14–54)
AST: 24 U/L (ref 15–41)
Anion gap: 10 (ref 5–15)
BILIRUBIN TOTAL: 0.3 mg/dL (ref 0.3–1.2)
BUN: 14 mg/dL (ref 6–20)
CO2: 25 mmol/L (ref 22–32)
CREATININE: 0.55 mg/dL (ref 0.44–1.00)
Calcium: 9.4 mg/dL (ref 8.9–10.3)
Chloride: 100 mmol/L — ABNORMAL LOW (ref 101–111)
GFR calc Af Amer: >60 mL/min (ref >60)
GLUCOSE: 186 mg/dL — AB (ref 65–99)
Potassium: 4 mmol/L (ref 3.5–5.1)
Sodium: 135 mmol/L (ref 135–145)
TOTAL PROTEIN: 8 g/dL (ref 6.5–8.1)

## 2017-06-06 LAB — URINALYSIS, ROUTINE W REFLEX MICROSCOPIC
BILIRUBIN URINE: NEGATIVE
GLUCOSE, UA: NEGATIVE mg/dL
Hgb urine dipstick: NEGATIVE
KETONES UR: NEGATIVE mg/dL
LEUKOCYTES UA: NEGATIVE
NITRITE: NEGATIVE
PH: 7.5 (ref 5.0–8.0)
PROTEIN: NEGATIVE mg/dL
Specific Gravity, Urine: 1.014 (ref 1.005–1.030)

## 2017-06-06 LAB — LIPASE, BLOOD: LIPASE: 33 U/L (ref 11–51)

## 2017-06-06 MED ORDER — LORAZEPAM 2 MG/ML IJ SOLN
1.0000 mg | Freq: Once | INTRAMUSCULAR | Status: AC
  Administered 2017-06-06: 1 mg via INTRAVENOUS
  Filled 2017-06-06: qty 1

## 2017-06-06 MED ORDER — LORAZEPAM 1 MG PO TABS: 1.0000 mg | ORAL_TABLET | Freq: Three times a day (TID) | ORAL | 0 refills | Status: AC | PRN

## 2017-06-06 MED ORDER — SODIUM CHLORIDE 0.9 % IV BOLUS (SEPSIS)
1000.0000 mL | Freq: Once | INTRAVENOUS | Status: AC
  Administered 2017-06-06: 1000 mL via INTRAVENOUS

## 2017-06-06 MED ORDER — ONDANSETRON HCL 4 MG/2ML IJ SOLN
INTRAMUSCULAR | Status: AC
  Filled 2017-06-06: qty 2

## 2017-06-06 MED ORDER — ONDANSETRON HCL 4 MG/2ML IJ SOLN
4.0000 mg | Freq: Once | INTRAMUSCULAR | Status: AC
  Administered 2017-06-06: 4 mg via INTRAVENOUS

## 2017-06-06 NOTE — ED Notes (Signed)
Placed pt on bedpan to void, informed her that urine sample is needed.  Pt appears to be feeling much better and states that she is feeling "a little better"

## 2017-06-06 NOTE — ED Notes (Signed)
Spoke with Dr. Read Drivers about pt, v.o received

## 2017-06-06 NOTE — ED Notes (Signed)
Pt has been sleeping, she was not successful at using the bedpan to give a urine sample.  Pt states that she can void if she gets up. Pt helped to ambulate to the bathroom to void.  Pt was able to ambulate with staff assist.  Pt appears to be tired but nausea has subsided.  Pt and family member napping in room, no acute distress at this time

## 2017-06-06 NOTE — ED Provider Notes (Signed)
[facility] Provider Note: Lowella Dell, MD, FACEP  CSN: [id number] MRN: [id number] ARRIVAL: 06/05/17 at 2336 ROOM: MH07/[facility]07   CHIEF COMPLAINT  Vomiting   HISTORY OF PRESENT ILLNESS  06/06/17 12:37 AM Valia Thurner is a 83 y.o. female who underwent a Nissen fundoplication on July 23 of this year for acid reflux and hiatal hernia. She has had a postoperative course complicated by intractable nausea and vomiting. She was recently admitted to the hospital for 3 weeks. During the hospitalization she had a gastrostomy tube placed to improve her enteral nutrition. She was treated with Reglan with improvement and was discharged home from the hospital 4 days ago. She was not discharged on Phenergan or Reglan and the nausea and vomiting returned the next day. She has had uncontrolled nausea and vomiting since. Her daughter contacted the surgeon on call and she had prescriptions for Reglan and Phenergan called in but her symptoms have persisted. She has not had pain with this except for one episode of left shoulder pain which had occurred early in her course postoperatively. She improved with oxycodone and a lidocaine patch. She has had a temperature as high as 99.7. She did have a bowel movement yesterday. She was given Zofran and Ativan IV prior to my evaluation she has now somnolent but arousable.   Past Medical History:  Diagnosis Date  . Asthma   . Barrett esophagus   . HLD (hyperlipidemia)   . Hypothyroidism   . IBS (irritable bowel syndrome)   . Pulmonary fibrosis (HCC)     Past Surgical History:  Procedure Laterality Date  . ABDOMINAL HYSTERECTOMY  1977  . APPENDECTOMY  1953  . HERNIA REPAIR    . NISSEN FUNDOPLICATION      History reviewed. No pertinent family history.  Social History  Substance Use Topics  . Smoking status: Never Smoker  . Smokeless tobacco: Never Used  . Alcohol use No    Prior to Admission medications   Medication Sig Start Date End Date  Taking? Authorizing Provider  amoxicillin-clavulanate (AUGMENTIN) 875-125 MG tablet Take 1 tablet by mouth 2 (two) times daily. One po bid x 14 days 08/21/16   Palumbo, April, MD  calcium carbonate (OSCAL) 1500 (600 Ca) MG TABS tablet Take 600 mg of elemental calcium by mouth 2 (two) times daily with a meal.    [provider]  cholecalciferol (VITAMIN D) 1000 units tablet Take 1,000 Units by mouth daily.    [provider]  dicyclomine (BENTYL) 20 MG tablet Take 1 tablet (20 mg total) by mouth 2 (two) times daily. 02/28/16   Jacalyn Lefevre, MD  fexofenadine (ALLEGRA) 30 MG tablet Take 30 mg by mouth 2 (two) times daily.    [provider]  HYDROcodone-acetaminophen (NORCO/VICODIN) 5-325 MG tablet Take 1 tablet by mouth every 6 (six) hours as needed for moderate pain.    [provider]  lactulose (CHRONULAC) 10 GM/15ML solution Take 30 mLs (20 g total) by mouth 2 (two) times daily as needed for mild constipation or severe constipation. 02/28/16   Jacalyn Lefevre, MD  levothyroxine (SYNTHROID, LEVOTHROID) 112 MCG tablet Take 112 mcg by mouth daily before breakfast.    [provider]  omeprazole (PRILOSEC) 20 MG capsule Take 20 mg by mouth daily.    [provider]  phenazopyridine (PYRIDIUM) 200 MG tablet Take 1 tablet (200 mg total) by mouth 3 (three) times daily. 08/21/16   Palumbo, April, MD  sertraline (ZOLOFT) 100 MG tablet Take  100 mg by mouth daily.    [provider]  simvastatin (ZOCOR) 40 MG tablet Take 40 mg by mouth daily.    [provider]    Allergies Macrobid [nitrofurantoin]; Percocet [oxycodone-acetaminophen]; Pneumococcal vaccines; and Prednisone   REVIEW OF SYSTEMS  Negative except as noted here or in the History of Present Illness.   PHYSICAL EXAMINATION  Initial Vital Signs Blood pressure 133/80, pulse 95, temperature 99.1 F (37.3 C), temperature source Oral, resp. rate 18, height 5' 1.5" (1.562  m), weight 68 kg (150 lb), SpO2 100 %.  Examination General: Well-developed, well-nourished female in no acute distress; appearance consistent with age of record HENT: normocephalic; atraumatic Eyes: pupils equal, round and reactive to light Neck: supple Heart: regular rate and rhythm Lungs: clear to auscultation bilaterally Abdomen: soft; nondistended; mild diffuse tenderness; gastrostomy tube in upper abdomen; no masses or hepatosplenomegaly; bowel sounds present Extremities: No deformity; full range of motion; pulses normal Neurologic: Somnolent but arousable; able to answer questions; motor function intact in all extremities and symmetric; no facial droop Skin: Warm and dry Psychiatric: Flat affect   RESULTS  Summary of this visit's results, reviewed by myself:   EKG Interpretation  Date/Time:    Ventricular Rate:    PR Interval:    QRS Duration:   QT Interval:    QTC Calculation:   R Axis:     Text Interpretation:        Laboratory Studies: Results for orders placed or performed during the hospital encounter of 06/05/17 (from the past 24 hour(s))  Comprehensive metabolic panel     Status: Abnormal   Collection Time: 06/06/17 12:00 AM  Result Value Ref Range   Sodium 135 135 - 145 mmol/L   Potassium 4.0 3.5 - 5.1 mmol/L   Chloride 100 (L) 101 - 111 mmol/L   CO2 25 22 - 32 mmol/L   Glucose, Bld 186 (H) 65 - 99 mg/dL   BUN 14 6 - 20 mg/dL   Creatinine, Ser 1.61 0.44 - 1.00 mg/dL   Calcium 9.4 8.9 - 09.6 mg/dL   Total Protein 8.0 6.5 - 8.1 g/dL   Albumin 3.9 3.5 - 5.0 g/dL   AST 24 15 - 41 U/L   ALT 16 14 - 54 U/L   Alkaline Phosphatase 89 38 - 126 U/L   Total Bilirubin 0.3 0.3 - 1.2 mg/dL   GFR calc non Af Amer >60 >60 mL/min   GFR calc Af Amer >60 >60 mL/min   Anion gap 10 5 - 15  Lipase, blood     Status: None   Collection Time: 06/06/17 12:00 AM  Result Value Ref Range   Lipase 33 11 - 51 U/L  CBC with Diff     Status: Abnormal   Collection Time:  06/06/17 12:00 AM  Result Value Ref Range   WBC 7.4 4.0 - 10.5 K/uL   RBC 3.79 (L) 3.87 - 5.11 MIL/uL   Hemoglobin 11.0 (L) 12.0 - 15.0 g/dL   HCT 04.5 (L) 40.9 - 81.1 %   MCV 89.2 78.0 - 100.0 fL   MCH 29.0 26.0 - 34.0 pg   MCHC 32.5 30.0 - 36.0 g/dL   RDW 91.4 78.2 - 95.6 %   Platelets 257 150 - 400 K/uL   Neutrophils Relative % 86 %   Neutro Abs 6.3 1.7 - 7.7 K/uL   Lymphocytes Relative 10 %   Lymphs Abs 0.7 0.7 - 4.0 K/uL   Monocytes Relative 3 %  Monocytes Absolute 0.3 0.1 - 1.0 K/uL   Eosinophils Relative 1 %   Eosinophils Absolute 0.1 0.0 - 0.7 K/uL   Basophils Relative 0 %   Basophils Absolute 0.0 0.0 - 0.1 K/uL  Urinalysis, Routine w reflex microscopic     Status: Abnormal   Collection Time: 06/06/17  2:00 AM  Result Value Ref Range   Color, Urine YELLOW YELLOW   APPearance CLOUDY (A) CLEAR   Specific Gravity, Urine 1.014 1.005 - 1.030   pH 7.5 5.0 - 8.0   Glucose, UA NEGATIVE NEGATIVE mg/dL   Hgb urine dipstick NEGATIVE NEGATIVE   Bilirubin Urine NEGATIVE NEGATIVE   Ketones, ur NEGATIVE NEGATIVE mg/dL   Protein, ur NEGATIVE NEGATIVE mg/dL   Nitrite NEGATIVE NEGATIVE   Leukocytes, UA NEGATIVE NEGATIVE   Imaging Studies: No results found.  ED COURSE  Nursing notes and initial vitals signs, including pulse oximetry, reviewed.  Vitals:   06/05/17 2345 06/05/17 2349 06/06/17 0208  BP:  133/80 (!) 156/81  Pulse:  95 91  Resp:  18 16  Temp:  99.1 F (37.3 C) 98.1 F (36.7 C)  TempSrc:  Oral Oral  SpO2:  100% 95%  Weight: 68 kg (150 lb)    Height: 5' 1.5" (1.562 m)     2:44 AM Patient's nausea and retching significantly improved after IV Ativan. She has been belching but not vomiting. We will prescribe Ativan as needed. Her daughter was advised that Ativan can cause sedation and confusion but she has tolerated it well in the ED. Her daughter has been staying with her around-the-clock.  PROCEDURES    ED DIAGNOSES     ICD-10-CM   1. Postoperative  nausea and vomiting R11.2    Z98.890        Paula Libra, MD 06/06/17 [phone]

## 2017-06-06 NOTE — ED Notes (Signed)
Pt is sleepy, no further nausea

## 2017-06-06 NOTE — ED Notes (Signed)
Pt is having continuing dry heaves while here in the room, appears to be feeling unwell.  Daughter states that she has been having continuing nausea and dry heaves for 3 days. Pt has had problems with nausea and has been hospitalized for this at [facility] x2.  She was d/c on Thursday.  No abdominal pain.  Pt had her LBM this pm.  Pt has peg in place and is receiving feedings by this to supplement po intake.

## 2017-07-28 ENCOUNTER — Emergency Department (HOSPITAL_BASED_OUTPATIENT_CLINIC_OR_DEPARTMENT_OTHER): Payer: Medicare Other

## 2017-07-28 ENCOUNTER — Emergency Department (HOSPITAL_BASED_OUTPATIENT_CLINIC_OR_DEPARTMENT_OTHER)
Admission: EM | Admit: 2017-07-28 | Discharge: 2017-07-29 | Disposition: A | Discharge status: Home / Self Care | Payer: Medicare Other | Attending: Emergency Medicine | Admitting: Emergency Medicine

## 2017-07-28 ENCOUNTER — Encounter (HOSPITAL_BASED_OUTPATIENT_CLINIC_OR_DEPARTMENT_OTHER): Payer: Self-pay | Admitting: Emergency Medicine

## 2017-07-28 DIAGNOSIS — E039 Hypothyroidism, unspecified: Secondary | ICD-10-CM | POA: Insufficient documentation

## 2017-07-28 DIAGNOSIS — K5732 Diverticulitis of large intestine without perforation or abscess without bleeding: Secondary | ICD-10-CM | POA: Insufficient documentation

## 2017-07-28 DIAGNOSIS — J45909 Unspecified asthma, uncomplicated: Secondary | ICD-10-CM | POA: Insufficient documentation

## 2017-07-28 DIAGNOSIS — Z931 Gastrostomy status: Secondary | ICD-10-CM | POA: Diagnosis not present

## 2017-07-28 DIAGNOSIS — K5792 Diverticulitis of intestine, part unspecified, without perforation or abscess without bleeding: Secondary | ICD-10-CM

## 2017-07-28 DIAGNOSIS — K582 Mixed irritable bowel syndrome: Secondary | ICD-10-CM | POA: Diagnosis not present

## 2017-07-28 DIAGNOSIS — R1084 Generalized abdominal pain: Secondary | ICD-10-CM | POA: Diagnosis present

## 2017-07-28 DIAGNOSIS — Z79899 Other long term (current) drug therapy: Secondary | ICD-10-CM | POA: Insufficient documentation

## 2017-07-28 DIAGNOSIS — R600 Localized edema: Secondary | ICD-10-CM | POA: Diagnosis not present

## 2017-07-28 DIAGNOSIS — R198 Other specified symptoms and signs involving the digestive system and abdomen: Secondary | ICD-10-CM

## 2017-07-28 LAB — I-STAT CG4 LACTIC ACID, ED: Lactic Acid, Venous: 1.52 mmol/L (ref 0.5–1.9)

## 2017-07-28 LAB — COMPREHENSIVE METABOLIC PANEL
ALBUMIN: 4.3 g/dL (ref 3.5–5.0)
ALT: 22 U/L (ref 14–54)
ANION GAP: 12 (ref 5–15)
AST: 36 U/L (ref 15–41)
Alkaline Phosphatase: 95 U/L (ref 38–126)
BILIRUBIN TOTAL: 0.9 mg/dL (ref 0.3–1.2)
BUN: 11 mg/dL (ref 6–20)
CHLORIDE: 97 mmol/L — AB (ref 101–111)
CO2: 24 mmol/L (ref 22–32)
Calcium: 9.7 mg/dL (ref 8.9–10.3)
Creatinine, Ser: 0.63 mg/dL (ref 0.44–1.00)
GFR calc Af Amer: >60 mL/min (ref >60)
GFR calc non Af Amer: >60 mL/min (ref >60)
GLUCOSE: 149 mg/dL — AB (ref 65–99)
POTASSIUM: 3.4 mmol/L — AB (ref 3.5–5.1)
SODIUM: 133 mmol/L — AB (ref 135–145)
Total Protein: 8.4 g/dL — ABNORMAL HIGH (ref 6.5–8.1)

## 2017-07-28 LAB — BRAIN NATRIURETIC PEPTIDE: B NATRIURETIC PEPTIDE 5: 31.9 pg/mL (ref 0.0–100.0)

## 2017-07-28 LAB — URINALYSIS, ROUTINE W REFLEX MICROSCOPIC
BILIRUBIN URINE: NEGATIVE
GLUCOSE, UA: NEGATIVE mg/dL
Hgb urine dipstick: NEGATIVE
KETONES UR: NEGATIVE mg/dL
Leukocytes, UA: NEGATIVE
NITRITE: NEGATIVE
PH: 6 (ref 5.0–8.0)
Protein, ur: NEGATIVE mg/dL

## 2017-07-28 LAB — CBC
HEMATOCRIT: 36.2 % (ref 36.0–46.0)
HEMOGLOBIN: 11.9 g/dL — AB (ref 12.0–15.0)
MCH: 29 pg (ref 26.0–34.0)
MCHC: 32.9 g/dL (ref 30.0–36.0)
MCV: 88.1 fL (ref 78.0–100.0)
Platelets: 243 10*3/uL (ref 150–400)
RBC: 4.11 MIL/uL (ref 3.87–5.11)
RDW: 14.3 % (ref 11.5–15.5)
WBC: 11.8 10*3/uL — ABNORMAL HIGH (ref 4.0–10.5)

## 2017-07-28 LAB — LIPASE, BLOOD: Lipase: 21 U/L (ref 11–51)

## 2017-07-28 MED ORDER — MORPHINE SULFATE (PF) 4 MG/ML IV SOLN
4.0000 mg | Freq: Once | INTRAVENOUS | Status: AC
  Administered 2017-07-28: 4 mg via INTRAVENOUS
  Filled 2017-07-28: qty 1

## 2017-07-28 MED ORDER — ONDANSETRON HCL 4 MG/2ML IJ SOLN
4.0000 mg | Freq: Once | INTRAMUSCULAR | Status: AC
  Administered 2017-07-28: 4 mg via INTRAVENOUS
  Filled 2017-07-28: qty 2

## 2017-07-28 MED ORDER — FENTANYL CITRATE (PF) 100 MCG/2ML IJ SOLN
50.0000 ug | Freq: Once | INTRAMUSCULAR | Status: AC
  Administered 2017-07-28: 50 ug via INTRAVENOUS
  Filled 2017-07-28: qty 2

## 2017-07-28 MED ORDER — IOPAMIDOL (ISOVUE-300) INJECTION 61%
100.0000 mL | Freq: Once | INTRAVENOUS | Status: AC | PRN
  Administered 2017-07-28: 100 mL via INTRAVENOUS

## 2017-07-28 NOTE — ED Triage Notes (Addendum)
Constipation and abd pain x 3 days. Last good BM on Sunday. Pt has tried multiple OTC treatments without relief. Pt is nauseated and dry heaving in triage.

## 2017-07-28 NOTE — ED Provider Notes (Signed)
[facility] EMERGENCY DEPARTMENT Provider Note   CSN: [id number] Arrival date & time: 07/28/17  1753     History   Chief Complaint Chief Complaint  Patient presents with  . Abdominal Pain    HPI Haley Rangel is a 83 y.o. female.  The history is provided by the patient and a relative.  Abdominal Pain   This is a new problem. The current episode started more than 2 days ago. The problem occurs constantly. The problem has been gradually worsening. The pain is located in the generalized abdominal region. The quality of the pain is aching, cramping and sharp. The pain is at a severity of 6/10. The pain is moderate. Associated symptoms include constipation. Pertinent negatives include fever, diarrhea, flatus, nausea, vomiting, dysuria, frequency and headaches. Nothing aggravates the symptoms. Nothing relieves the symptoms. Her past medical history is significant for gallstones. Her past medical history does not include GERD, Crohn's disease or irritable bowel syndrome.    Past Medical History:  Diagnosis Date  . Asthma   . Barrett esophagus   . HLD (hyperlipidemia)   . Hypothyroidism   . IBS (irritable bowel syndrome)   . Pulmonary fibrosis (HCC)     There are no active problems to display for this patient.   Past Surgical History:  Procedure Laterality Date  . ABDOMINAL HYSTERECTOMY  1977  . APPENDECTOMY  1953  . GASTROSTOMY W/ FEEDING TUBE    . HERNIA REPAIR    . NISSEN FUNDOPLICATION      OB History    No data available       Home Medications    Prior to Admission medications   Medication Sig Start Date End Date Taking? Authorizing Provider  amoxicillin-clavulanate (AUGMENTIN) 875-125 MG tablet Take 1 tablet by mouth 2 (two) times daily. One po bid x 14 days 08/21/16   Palumbo, April, MD  calcium carbonate (OSCAL) 1500 (600 Ca) MG TABS tablet Take 600 mg of elemental calcium by mouth 2 (two) times daily with a meal.    [provider]    cholecalciferol (VITAMIN D) 1000 units tablet Take 1,000 Units by mouth daily.    [provider]  dicyclomine (BENTYL) 20 MG tablet Take 1 tablet (20 mg total) by mouth 2 (two) times daily. 02/28/16   Jacalyn LefevreHaviland, Julie, MD  fexofenadine (ALLEGRA) 30 MG tablet Take 30 mg by mouth 2 (two) times daily.    [provider]  HYDROcodone-acetaminophen (NORCO/VICODIN) 5-325 MG tablet Take 1 tablet by mouth every 6 (six) hours as needed for moderate pain.    [provider]  lactulose (CHRONULAC) 10 GM/15ML solution Take 30 mLs (20 g total) by mouth 2 (two) times daily as needed for mild constipation or severe constipation. 02/28/16   Jacalyn LefevreHaviland, Julie, MD  levothyroxine (SYNTHROID, LEVOTHROID) 112 MCG tablet Take 112 mcg by mouth daily before breakfast.    [provider]  LORazepam (ATIVAN) 1 MG tablet Take 1 tablet (1 mg total) by mouth every 8 (eight) hours as needed (for vomiting; may cause drowsiness or confusion). 06/06/17   Molpus, John, MD  omeprazole (PRILOSEC) 20 MG capsule Take 20 mg by mouth daily.    [provider]  phenazopyridine (PYRIDIUM) 200 MG tablet Take 1 tablet (200 mg total) by mouth 3 (three) times daily. 08/21/16   Palumbo, April, MD  sertraline (ZOLOFT) 100 MG tablet Take 100 mg by mouth daily.    [provider]  simvastatin (ZOCOR) 40 MG tablet Take 40 mg  by mouth daily.    [provider]    Family History No family history on file.  Social History Social History  Substance Use Topics  . Smoking status: Never Smoker  . Smokeless tobacco: Never Used  . Alcohol use No     Allergies   Colace [docusate calcium]; Macrobid [nitrofurantoin]; Percocet [oxycodone-acetaminophen]; Pneumococcal vaccines; and Prednisone   Review of Systems Review of Systems  Constitutional: Negative for chills, diaphoresis, fatigue and fever.  HENT: Negative for congestion.   Eyes: Negative for photophobia and visual disturbance.   Respiratory: Negative for cough, chest tightness, shortness of breath and wheezing.   Cardiovascular: Negative for chest pain and palpitations.  Gastrointestinal: Positive for abdominal pain and constipation. Negative for abdominal distention, diarrhea, flatus, nausea and vomiting.  Genitourinary: Negative for dysuria, flank pain and frequency.  Musculoskeletal: Negative for back pain, neck pain and neck stiffness.  Skin: Negative for rash and wound.  Neurological: Negative for dizziness, weakness, light-headedness and headaches.  Psychiatric/Behavioral: Negative for agitation and confusion.  All other systems reviewed and are negative.    Physical Exam Updated Vital Signs BP (!) 152/97 (BP Location: Right Arm)   Pulse 97   Temp 98 F (36.7 C) (Oral)   Resp 20   Ht 5' 1.5" (1.562 m)   Wt 73.9 kg (163 lb)   SpO2 97%   BMI 30.30 kg/m   Physical Exam  Constitutional: She is oriented to person, place, and time. She appears well-developed and well-nourished. No distress.  HENT:  Head: Normocephalic.  Mouth/Throat: Oropharynx is clear and moist. No oropharyngeal exudate.  Eyes: Pupils are equal, round, and reactive to light. Conjunctivae and EOM are normal.  Neck: Normal range of motion.  Cardiovascular: Normal rate and intact distal pulses.   No murmur heard. Pulmonary/Chest: Effort normal. No stridor. No respiratory distress. She has no wheezes. She has no rales. She exhibits no tenderness.  Abdominal: Soft. She exhibits no distension and no mass. There is tenderness. There is no guarding.  Musculoskeletal: She exhibits no tenderness.  Neurological: She is alert and oriented to person, place, and time. No sensory deficit. She exhibits normal muscle tone.  Skin: Capillary refill takes less than 2 seconds. No rash noted. She is not diaphoretic. No erythema.  Psychiatric: She has a normal mood and affect.  Nursing note and vitals reviewed.    ED Treatments / Results   Labs (all labs ordered are listed, but only abnormal results are displayed) Labs Reviewed  COMPREHENSIVE METABOLIC PANEL - Abnormal; Notable for the following:       Result Value   Sodium 133 (*)    Potassium 3.4 (*)    Chloride 97 (*)    Glucose, Bld 149 (*)    Total Protein 8.4 (*)    All other components within normal limits  CBC - Abnormal; Notable for the following:    WBC 11.8 (*)    Hemoglobin 11.9 (*)    All other components within normal limits  URINALYSIS, ROUTINE W REFLEX MICROSCOPIC - Abnormal; Notable for the following:    Specific Gravity, Urine <1.005 (*)    All other components within normal limits  LIPASE, BLOOD  BRAIN NATRIURETIC PEPTIDE  I-STAT CG4 LACTIC ACID, ED    EKG  EKG Interpretation  Date/Time:  Thursday July 28 2017 18:13:06 EDT Ventricular Rate:  96 PR Interval:    QRS Duration: 96 QT Interval:  370 QTC Calculation: 468 R Axis:   19 Text Interpretation:  Sinus  rhythm Low voltage, extremity leads No prior ECG for comparison.  NO STEMI Confirmed by Theda Belfast ([id number]) on 07/28/2017 6:45:43 PM       Radiology Dg Chest 2 View  Result Date: 07/28/2017 CLINICAL DATA:  Lung base crackles.  Lower extremity edema. EXAM: CHEST  2 VIEW COMPARISON:  Chest x-ray dated December 06, 2016. FINDINGS: The cardiomediastinal silhouette is normal in size. Basal peripheral predominant subpleural reticular opacities and architectural distortion is similar to prior study. No pleural effusion or pneumothorax. No acute osseous abnormality. Unchanged large hiatal hernia. IMPRESSION: Stable chronic fibrotic changes.  No active cardiopulmonary disease. Electronically Signed   By: Obie Dredge M.D.   On: 07/28/2017 21:40   Ct Abdomen Pelvis W Contrast  Result Date: 07/28/2017 CLINICAL DATA:  Constipation and abdominal pain for 3 days. Last bowel movement on Sunday. No relief with over the counter treatments. Nausea. Abdominal distention. EXAM: CT ABDOMEN AND  PELVIS WITH CONTRAST TECHNIQUE: Multidetector CT imaging of the abdomen and pelvis was performed using the standard protocol following bolus administration of intravenous contrast. CONTRAST:  ISOVUE-300 IOPAMIDOL (ISOVUE-300) INJECTION 61% COMPARISON:  02/29/2016 FINDINGS: Lower chest: Peripheral fibrosis in the lung bases. Esophagus is mildly dilated with an air-fluid level present. There are postoperative changes in the EG junction, likely result of fundoplication. Changes may be due to reflux, dysmotility, or stricture. Hepatobiliary: Subcentimeter low-attenuation lesion in the right lobe of the liver likely representing a small cyst. No change since prior study. No other focal lesions identified. Gallbladder is distended. Small stone in the dependent gallbladder. No gallbladder wall thickening, edema, or inflammatory infiltration. No bile duct dilatation. Pancreas: Unremarkable. No pancreatic ductal dilatation or surrounding inflammatory changes. Spleen: Normal in size without focal abnormality. Adrenals/Urinary Tract: No adrenal gland nodules. Renal nephrograms are symmetrical. No solid renal lesions. Small parapelvic cysts bilaterally. No hydronephrosis or hydroureter. Bladder wall is not thickened and no bladder filling defects are identified. Stomach/Bowel: Gastrostomy tube along the greater curvature of the stomach. No gastric wall thickening. Small bowel are decompressed. Prominent diverticula in the sigmoid colon with infiltration in the pericolonic fat consistent with sigmoid diverticulitis. No abscess. Prominent stool in the sigmoid colon. Colon is borderline distended and is diffusely filled with fluid. This appearance is similar to previous study and may represent dysmotility or impaction with diarrhea. Appendix is not identified. Vascular/Lymphatic: Aortic atherosclerosis. No enlarged abdominal or pelvic lymph nodes. Reproductive: Status post hysterectomy. No adnexal masses. Other: No free air  or free fluid in the abdomen. There appears to be mild pelvic floor descent with small cystocele and rectocele present. Musculoskeletal: Degenerative changes throughout the spine. No destructive bone lesions. IMPRESSION: 1. Diverticulosis of the sigmoid colon with evidence of sigmoid diverticulitis. No abscess. 2. Prominent stool in the sigmoid colon with borderline distention of fluid-filled colon throughout. This may represent dysmotility or impaction. 3. Fibrosis in the lung bases. 4. Cholelithiasis with gallbladder distention. No inflammatory changes. 5. Gastrostomy tube in place. 6. Aortic atherosclerosis. 7. Pelvic floor descent with evidence of small cystocele and rectocele. Electronically Signed   By: Burman Nieves M.D.   On: 07/28/2017 21:41    Procedures Procedures (including critical care time)  Medications Ordered in ED Medications  metroNIDAZOLE (FLAGYL) tablet 500 mg (not administered)  ciprofloxacin (CIPRO) tablet 500 mg (not administered)  morphine 4 MG/ML injection 4 mg (4 mg Intravenous Given 07/28/17 1906)  ondansetron (ZOFRAN) injection 4 mg (4 mg Intravenous Given 07/28/17 1906)  fentaNYL (SUBLIMAZE) injection 50 mcg (50 mcg  Intravenous Given 07/28/17 1954)  iopamidol (ISOVUE-300) 61 % injection 100 mL (100 mLs Intravenous Contrast Given 07/28/17 2115)  fentaNYL (SUBLIMAZE) injection 50 mcg (50 mcg Intravenous Given 07/28/17 2137)  ondansetron (ZOFRAN) injection 4 mg (4 mg Intravenous Given 07/28/17 2137)     Initial Impression / Assessment and Plan / ED Course  I have reviewed the triage vital signs and the nursing notes.  Pertinent labs & imaging results that were available during my care of the patient were reviewed by me and considered in my medical decision making (see chart for details).     Amyrah Pinkhasov is a 83 y.o. female with a past medical history significant for pulmonary fibrosis, hyperlipidemia, hypothyroidism, asthma, prior hiatal hernia status post  Nissen fundoplication in July of this year status post gastrostomy tube who presents with abdominal pain, abdominal distention, nausea, vomiting, bilateral lower extremity edema, constipation, and decreased flatus. Patient is accompanied by family who report that over the last 5 days, patient has had worsening abdominal symptoms. Patient has had abdominal pain worsening since Sunday. She has not had a bowel movement since Sunday other than very small balls of stool. She reports she has not been passing gas. She reports a history of abdominal surgery earlier this year and has a feeding tube currently. She reports dry heaving with nausea and minimal vomiting. She denies fevers or chills. She denies congestion, cough, or shortness of breath. She does report some bilateral lower extremity edema that has been slowly worsening. She denies any urinary symptoms such as dysuria or frequency. Shedenies other complaints on arrival. She describes the pain as moderate to severe and denies any trauma.  On exam, abdomen is slightly distended. Patient has a feeding tube in place with tenderness all across her abdomen. Patient had minimal bowel sounds appreciated. No CVA tenderness or back tenderness. Lungs had coarse sounds consistent with the bone or fibrosis but there were some crackles in the bases. Mild bilateral lower extremity edema. Next  With the patient's edema in the legs and crackles in the lungs, she will have x-ray and BNP to look for fluid overload. Clinically there is concern for bowel obstruction with the distention, decreased flatus, and abdominal pain in the setting of nausea and no bowel movements. Patient will have laboratory testing and CT imaging to evaluate. Patient will be given pain medicine and nausea medicine. Anticipate reassessment after workup.  While awaiting diagnostic workup, patient had a large bowel movement. Patient's abdominal distention and pain began to improve. CT scan showed  diverticulitis and colon filled with stool.  Diagnostic laboratory testing was grossly reassuring. Mild leukocytosis. Based on improvement in vital signs and improvement in pain after bowel movement, do not feel patient requires admission at this time. Urinalysis showed no UTI. Suspect diverticulitis and the constipation caused the patient's discomfort.   Based on well appearance, patient felt stable for discharge home. Patient will follow-up with PCP and understood strict return precautions. Patient family had no other questions or concerns and patient discharged in good condition.   Final Clinical Impressions(s) / ED Diagnoses   Final diagnoses:  Diverticulitis  Generalized abdominal pain  Alternating constipation and diarrhea    New Prescriptions New Prescriptions   CIPROFLOXACIN (CIPRO) 500 MG TABLET    Take 1 tablet (500 mg total) by mouth every 12 (twelve) hours.   METRONIDAZOLE (FLAGYL) 500 MG TABLET    Take 1 tablet (500 mg total) by mouth 3 (three) times daily.    Clinical Impression: 1.  Diverticulitis   2. Generalized abdominal pain   3. Alternating constipation and diarrhea     Disposition: Discharge  Condition: Good  I have discussed the results, Dx and Tx plan with the pt(& family if present). He/she/they expressed understanding and agree(s) with the plan. Discharge instructions discussed at great length. Strict return precautions discussed and pt &/or family have verbalized understanding of the instructions. No further questions at time of discharge.    New Prescriptions   CIPROFLOXACIN (CIPRO) 500 MG TABLET    Take 1 tablet (500 mg total) by mouth every 12 (twelve) hours.   METRONIDAZOLE (FLAGYL) 500 MG TABLET    Take 1 tablet (500 mg total) by mouth 3 (three) times daily.    Follow Up: Andreas Blower., MD [address] [place] [address] [phone]  Schedule an appointment as soon as possible for a visit    [facility] [place]  EMERGENCY DEPARTMENT [address] [id number] [address] [place] [address] [phone]  If symptoms worsen     Dre Gamino, Canary Brim, MD 07/29/17 1313

## 2017-07-28 NOTE — ED Notes (Signed)
Pt had a moderate amount of liquid brown stool with small pieces of hard stool. States her pain is now gone.

## 2017-07-28 NOTE — ED Notes (Signed)
Pt is sitting on the bedpan. States she is expelling continuous liquid stool.

## 2017-07-28 NOTE — ED Notes (Signed)
Her pain is worse since the MS was given.

## 2017-07-29 DIAGNOSIS — K5732 Diverticulitis of large intestine without perforation or abscess without bleeding: Secondary | ICD-10-CM | POA: Diagnosis not present

## 2017-07-29 MED ORDER — CIPROFLOXACIN HCL 500 MG PO TABS
500.0000 mg | ORAL_TABLET | Freq: Two times a day (BID) | ORAL | 0 refills | Status: AC
Start: 2017-07-29 — End: 2017-08-08

## 2017-07-29 MED ORDER — METRONIDAZOLE 500 MG PO TABS
500.0000 mg | ORAL_TABLET | Freq: Once | ORAL | Status: AC
  Administered 2017-07-29: 500 mg via ORAL
  Filled 2017-07-29: qty 1

## 2017-07-29 MED ORDER — CIPROFLOXACIN HCL 500 MG PO TABS
500.0000 mg | ORAL_TABLET | Freq: Once | ORAL | Status: AC
  Administered 2017-07-29: 500 mg via ORAL
  Filled 2017-07-29: qty 1

## 2017-07-29 MED ORDER — METRONIDAZOLE 500 MG PO TABS: 500.0000 mg | ORAL_TABLET | Freq: Three times a day (TID) | ORAL | 0 refills | Status: AC

## 2017-07-29 NOTE — Discharge Instructions (Signed)
Your diagnostic workup today showed evidence of diverticulitis on the imaging. Your pain improved after your bowel movement. He received 1 dose of antibiotics in the ED. Please continue the antibiotics and stay hydrated. Please follow-up with your primary care physician in the next several days. If any symptoms change or worsen or you begin feeling dehydrated, please return to the nearest emergency department.

## 2019-03-18 IMAGING — CT CT ABD-PELV W/ CM
2 of 5 series · 15 of 46 positions shown, 17 images · IV contrast (APPLIED)
Comparison: 02/29/2016

CLINICAL DATA: Constipation and abdominal pain for 3 days. Last
bowel movement on [REDACTED]. No relief with over the counter
treatments. Nausea. Abdominal distention.

EXAM:
CT ABDOMEN AND PELVIS WITH CONTRAST
TECHNIQUE: Multidetector CT imaging of the abdomen and pelvis was performed
using the standard protocol following bolus administration of
intravenous contrast.
CONTRAST:  100mL [id number] IOPAMIDOL (NC9WLL-DNN) INJECTION 61%

[Series 2: axial st · axial · 0.81mm/px · z∈[+676,+1091]mm · 12 of 95 slices shown, 14 images]
[im 6/95  soft-tissue]
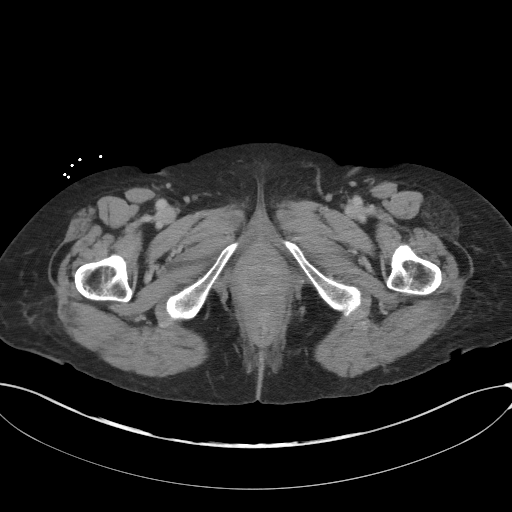
[im 6/95  bone]
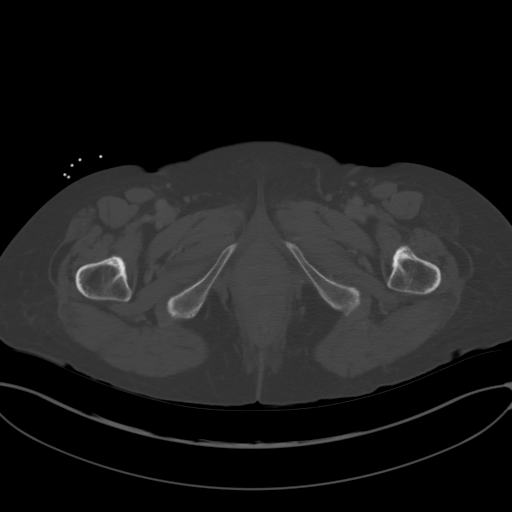
[im 17/95  soft-tissue]
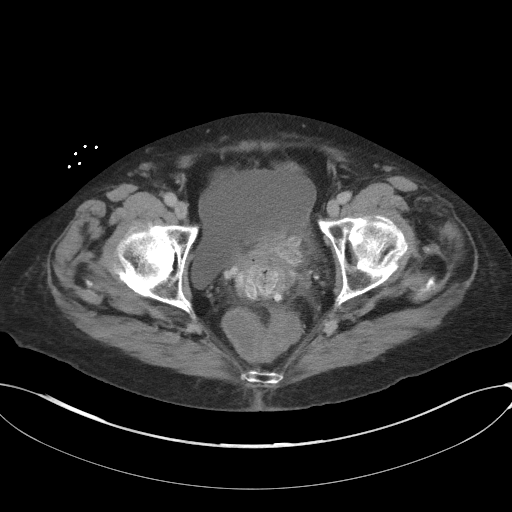
[im 23/95  soft-tissue]
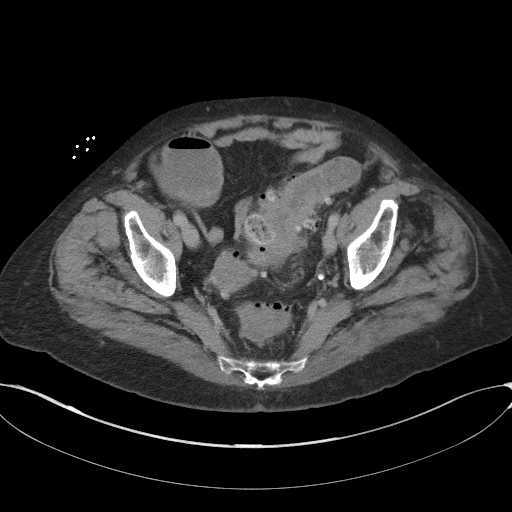
[im 28/95  soft-tissue]
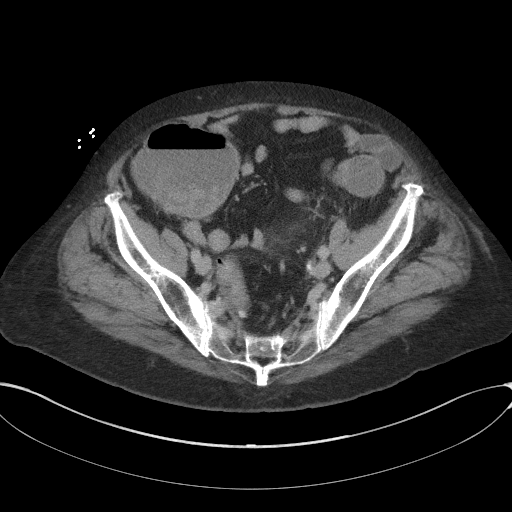
[im 39/95  soft-tissue]
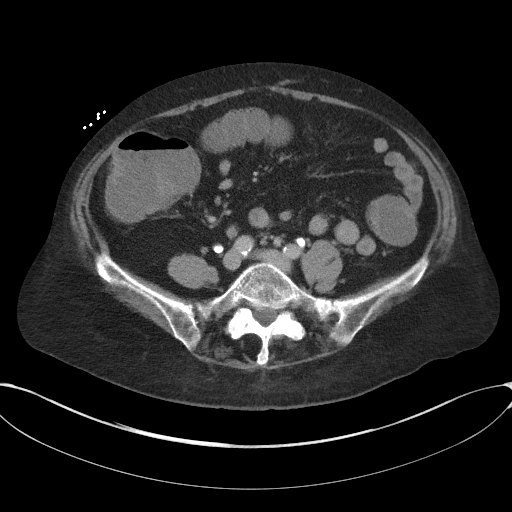
[im 45/95  soft-tissue]
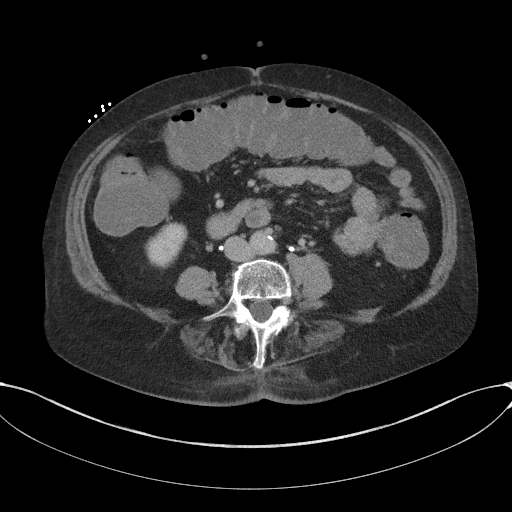
[im 50/95  soft-tissue]
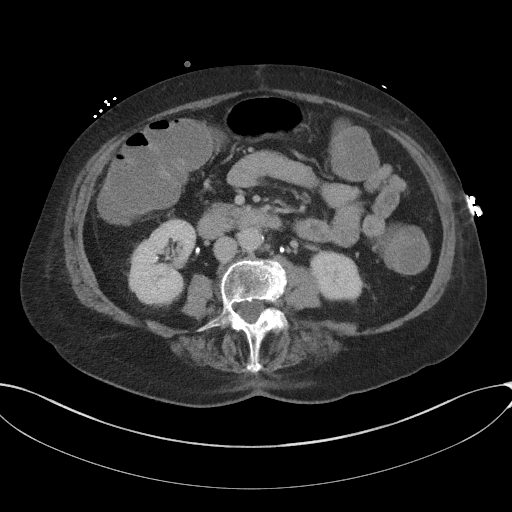
[im 61/95  soft-tissue]
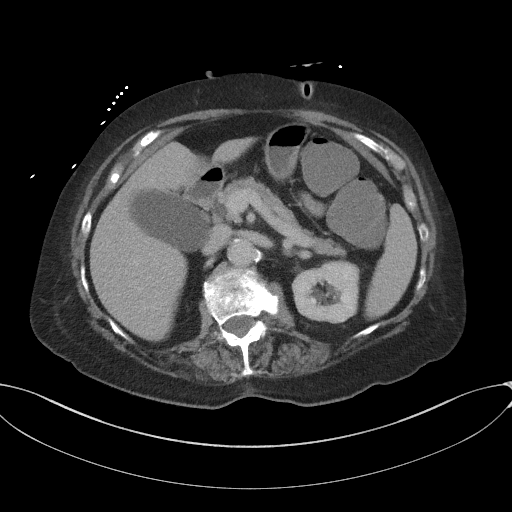
[im 67/95  soft-tissue]
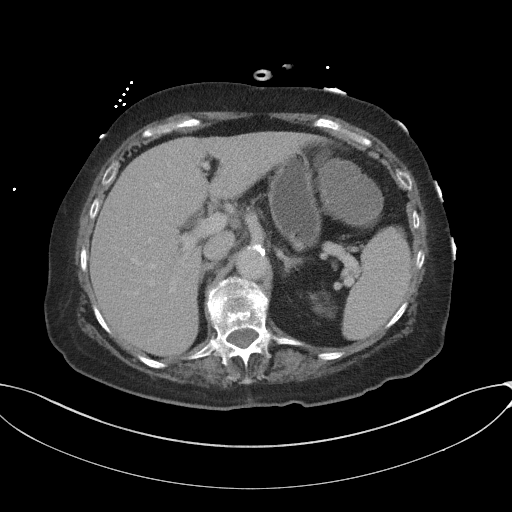
[im 67/95  bone]
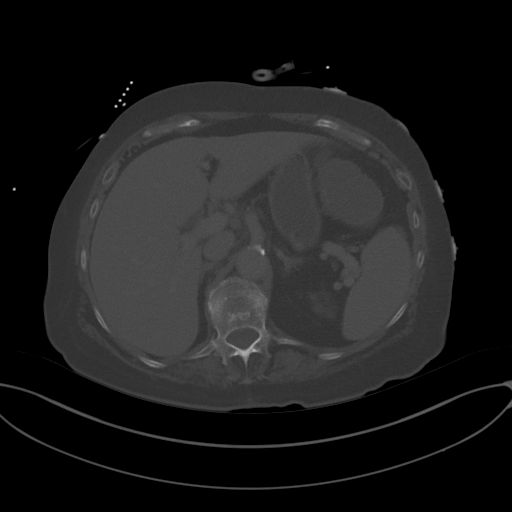
[im 72/95  soft-tissue]
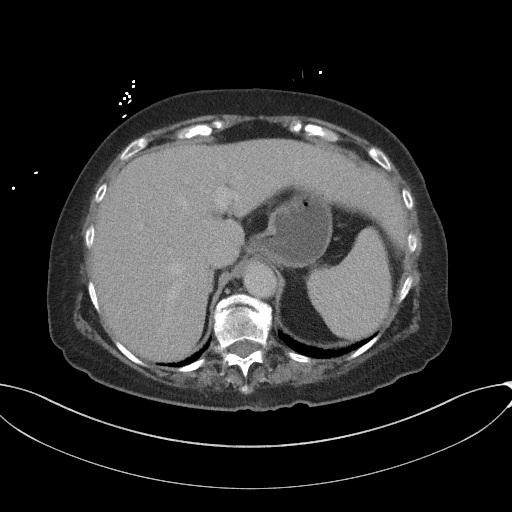
[im 83/95  soft-tissue]
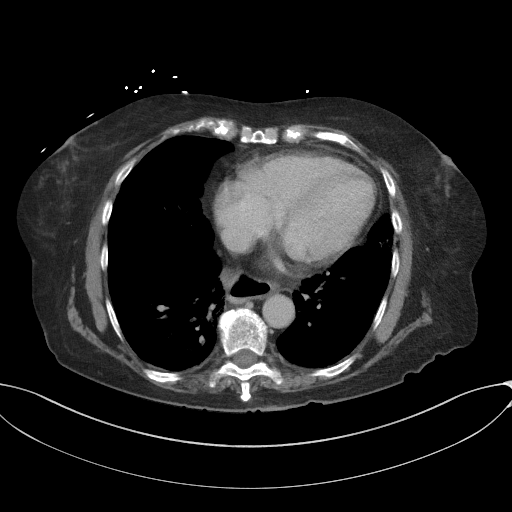
[im 89/95  soft-tissue]
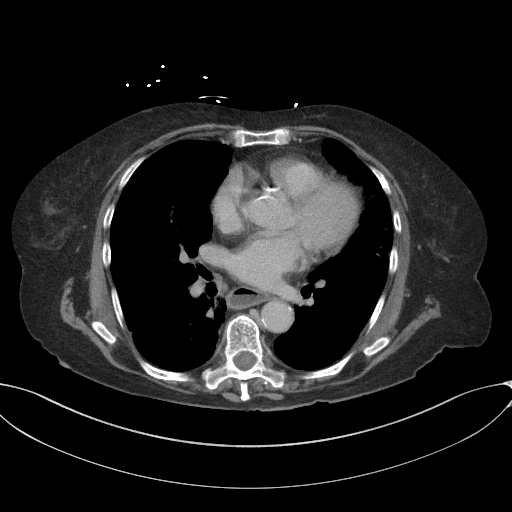

[Series 5: coronal st · coronal · 0.84mm/px · 3 of 101 slices shown]
[im 34/101  soft-tissue]
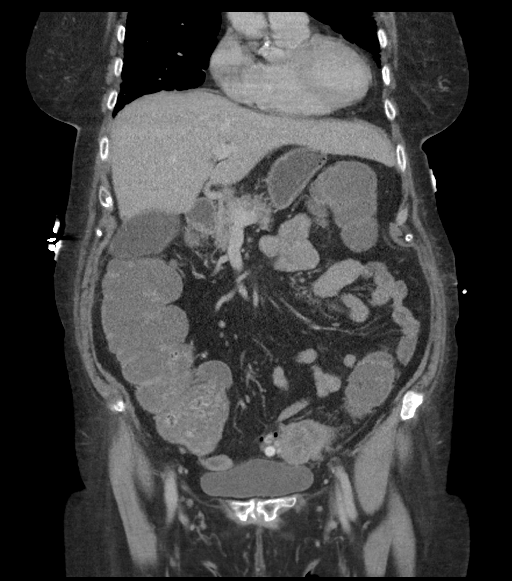
[im 45/101  soft-tissue]
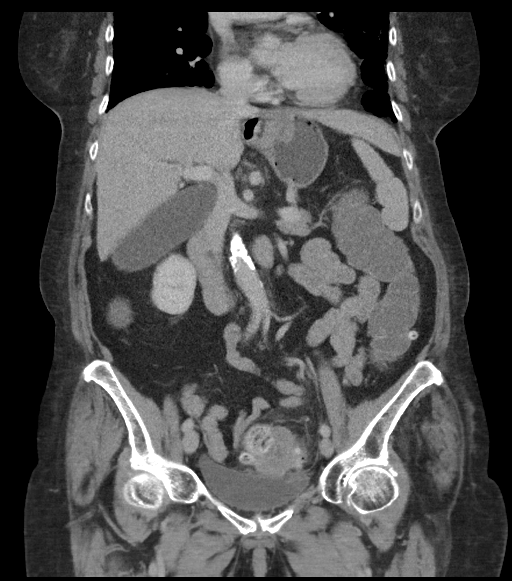
[im 56/101  soft-tissue]
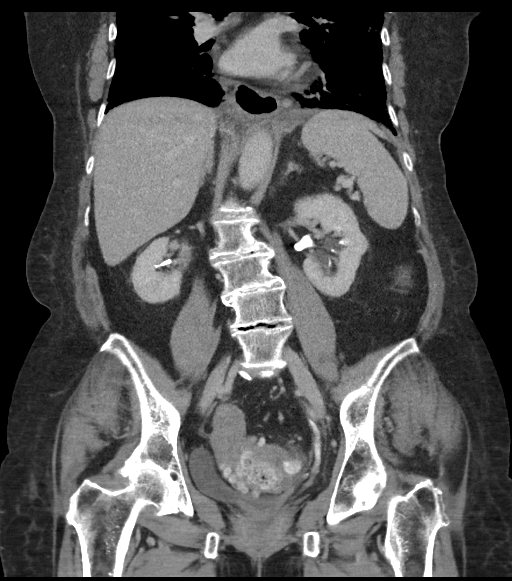

[15 of 46 positions shown; findings below may reference images not displayed]

FINDINGS: Lower chest: Peripheral fibrosis in the lung bases. Esophagus is
mildly dilated with an air-fluid level present. There are
postoperative changes in the EG junction, likely result of
fundoplication. Changes may be due to reflux, dysmotility, or
stricture.

Hepatobiliary: Subcentimeter low-attenuation lesion in the right
lobe of the liver likely representing a small cyst. No change since
prior study. No other focal lesions identified. Gallbladder is
distended. Small stone in the dependent gallbladder. No gallbladder
wall thickening, edema, or inflammatory infiltration. No bile duct
dilatation.

Pancreas: Unremarkable. No pancreatic ductal dilatation or
surrounding inflammatory changes.

Spleen: Normal in size without focal abnormality.

Adrenals/Urinary Tract: No adrenal gland nodules. Renal nephrograms
are symmetrical. No solid renal lesions. Small parapelvic cysts
bilaterally. No hydronephrosis or hydroureter. Bladder wall is not
thickened and no bladder filling defects are identified.

Stomach/Bowel: Gastrostomy tube along the greater curvature of the
stomach. No gastric wall thickening. Small bowel are decompressed.
Prominent diverticula in the sigmoid colon with infiltration in the
pericolonic fat consistent with sigmoid diverticulitis. No abscess.
Prominent stool in the sigmoid colon. Colon is borderline distended
and is diffusely filled with fluid. This appearance is similar to
previous study and may represent dysmotility or impaction with
diarrhea. Appendix is not identified.

Vascular/Lymphatic: Aortic atherosclerosis. No enlarged abdominal or
pelvic lymph nodes.

Reproductive: Status post hysterectomy. No adnexal masses.

Other: No free air or free fluid in the abdomen. There appears to be
mild pelvic floor descent with small cystocele and rectocele
present.

Musculoskeletal: Degenerative changes throughout the spine. No
destructive bone lesions.
IMPRESSION: 1. Diverticulosis of the sigmoid colon with evidence of sigmoid
diverticulitis. No abscess.
2. Prominent stool in the sigmoid colon with borderline distention
of fluid-filled colon throughout. This may represent dysmotility or
impaction.
3. Fibrosis in the lung bases.
4. Cholelithiasis with gallbladder distention. No inflammatory
changes.
5. Gastrostomy tube in place.
6. Aortic atherosclerosis.
7. Pelvic floor descent with evidence of small cystocele and
rectocele.

## 2019-03-18 IMAGING — CR DG CHEST 2V
2 series · 2 of 2 positions shown · non-contrast
Comparison: Chest x-ray dated December 06, 2016.

CLINICAL DATA: Lung base crackles.  Lower extremity edema.

EXAM:
CHEST  2 VIEW

[w chest pa]
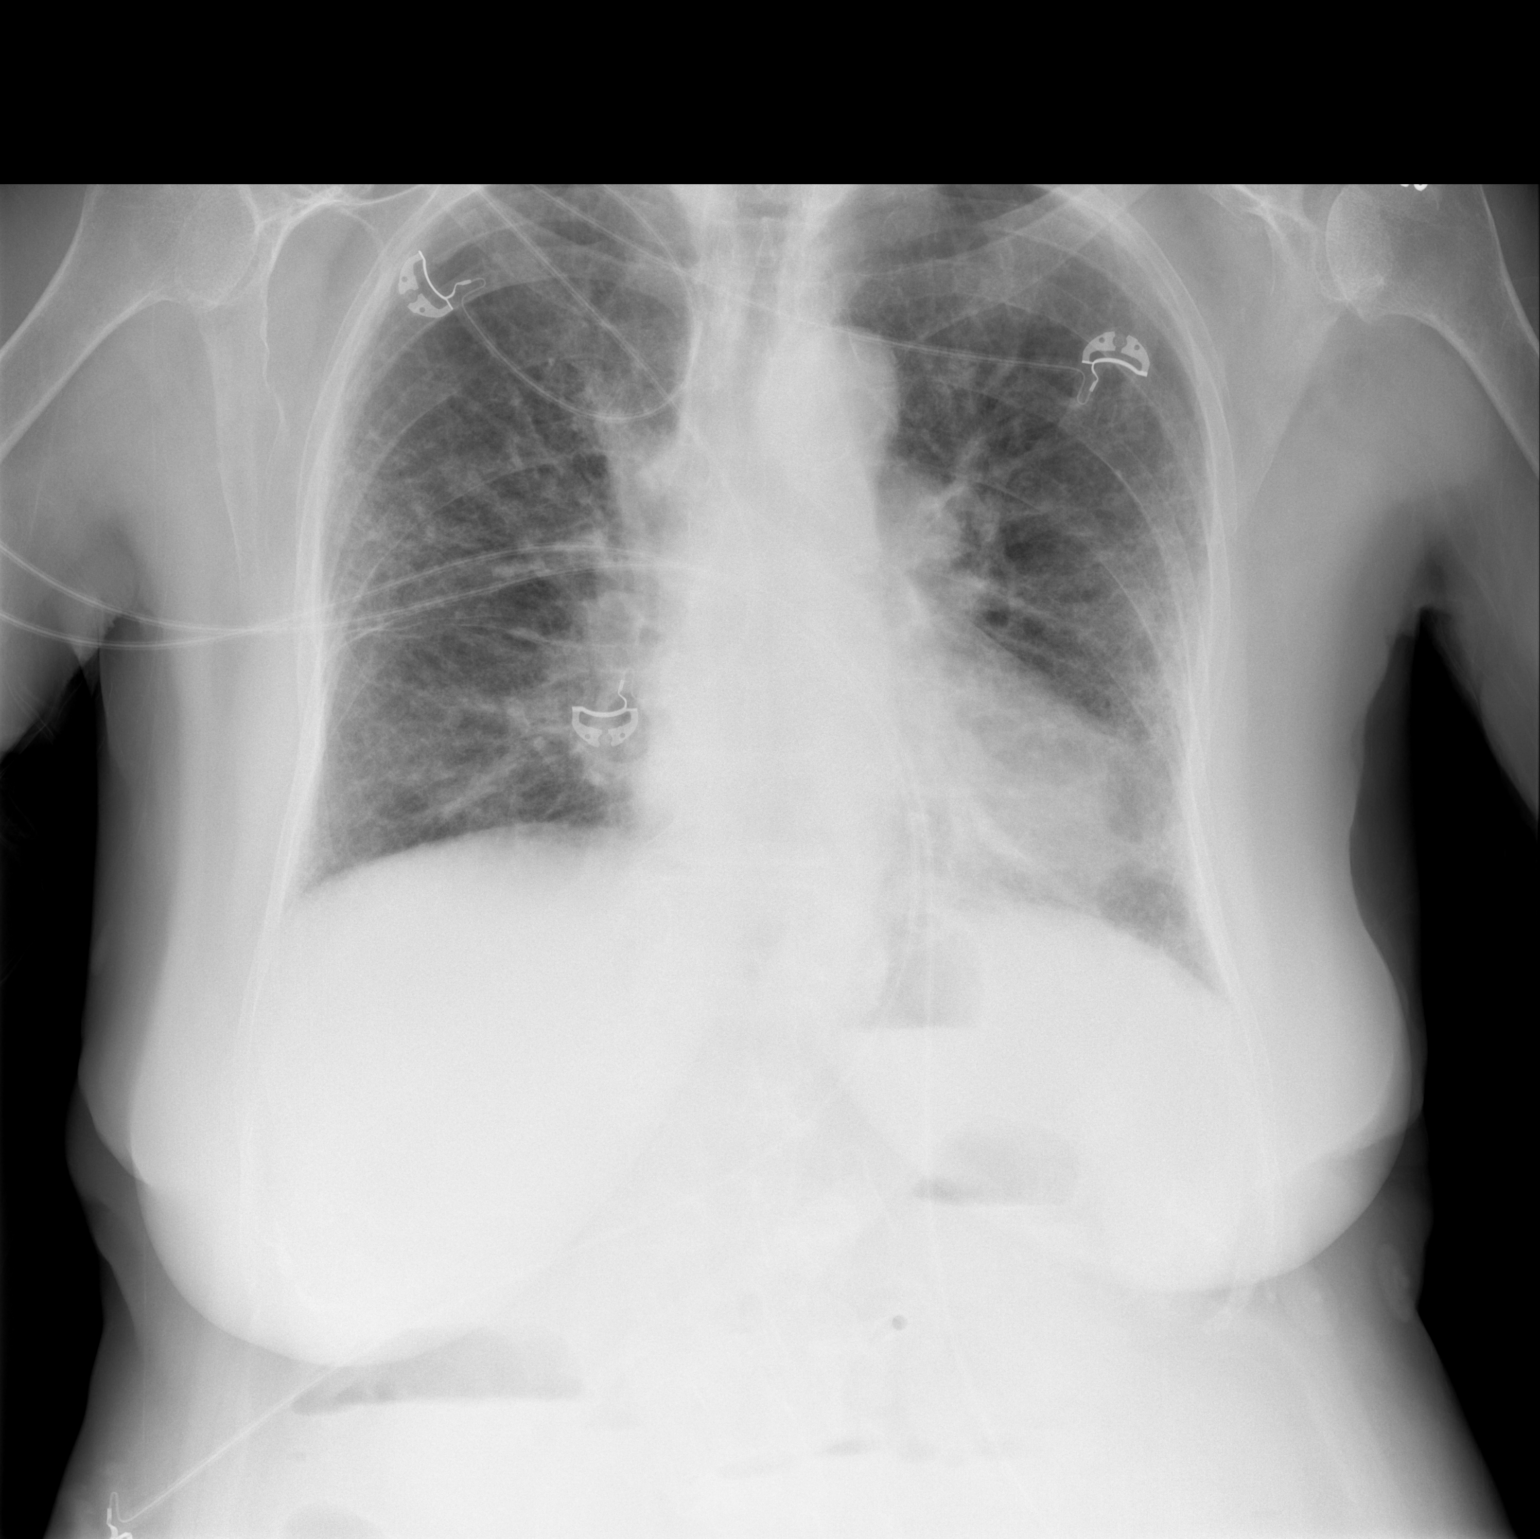

[w chest lat]
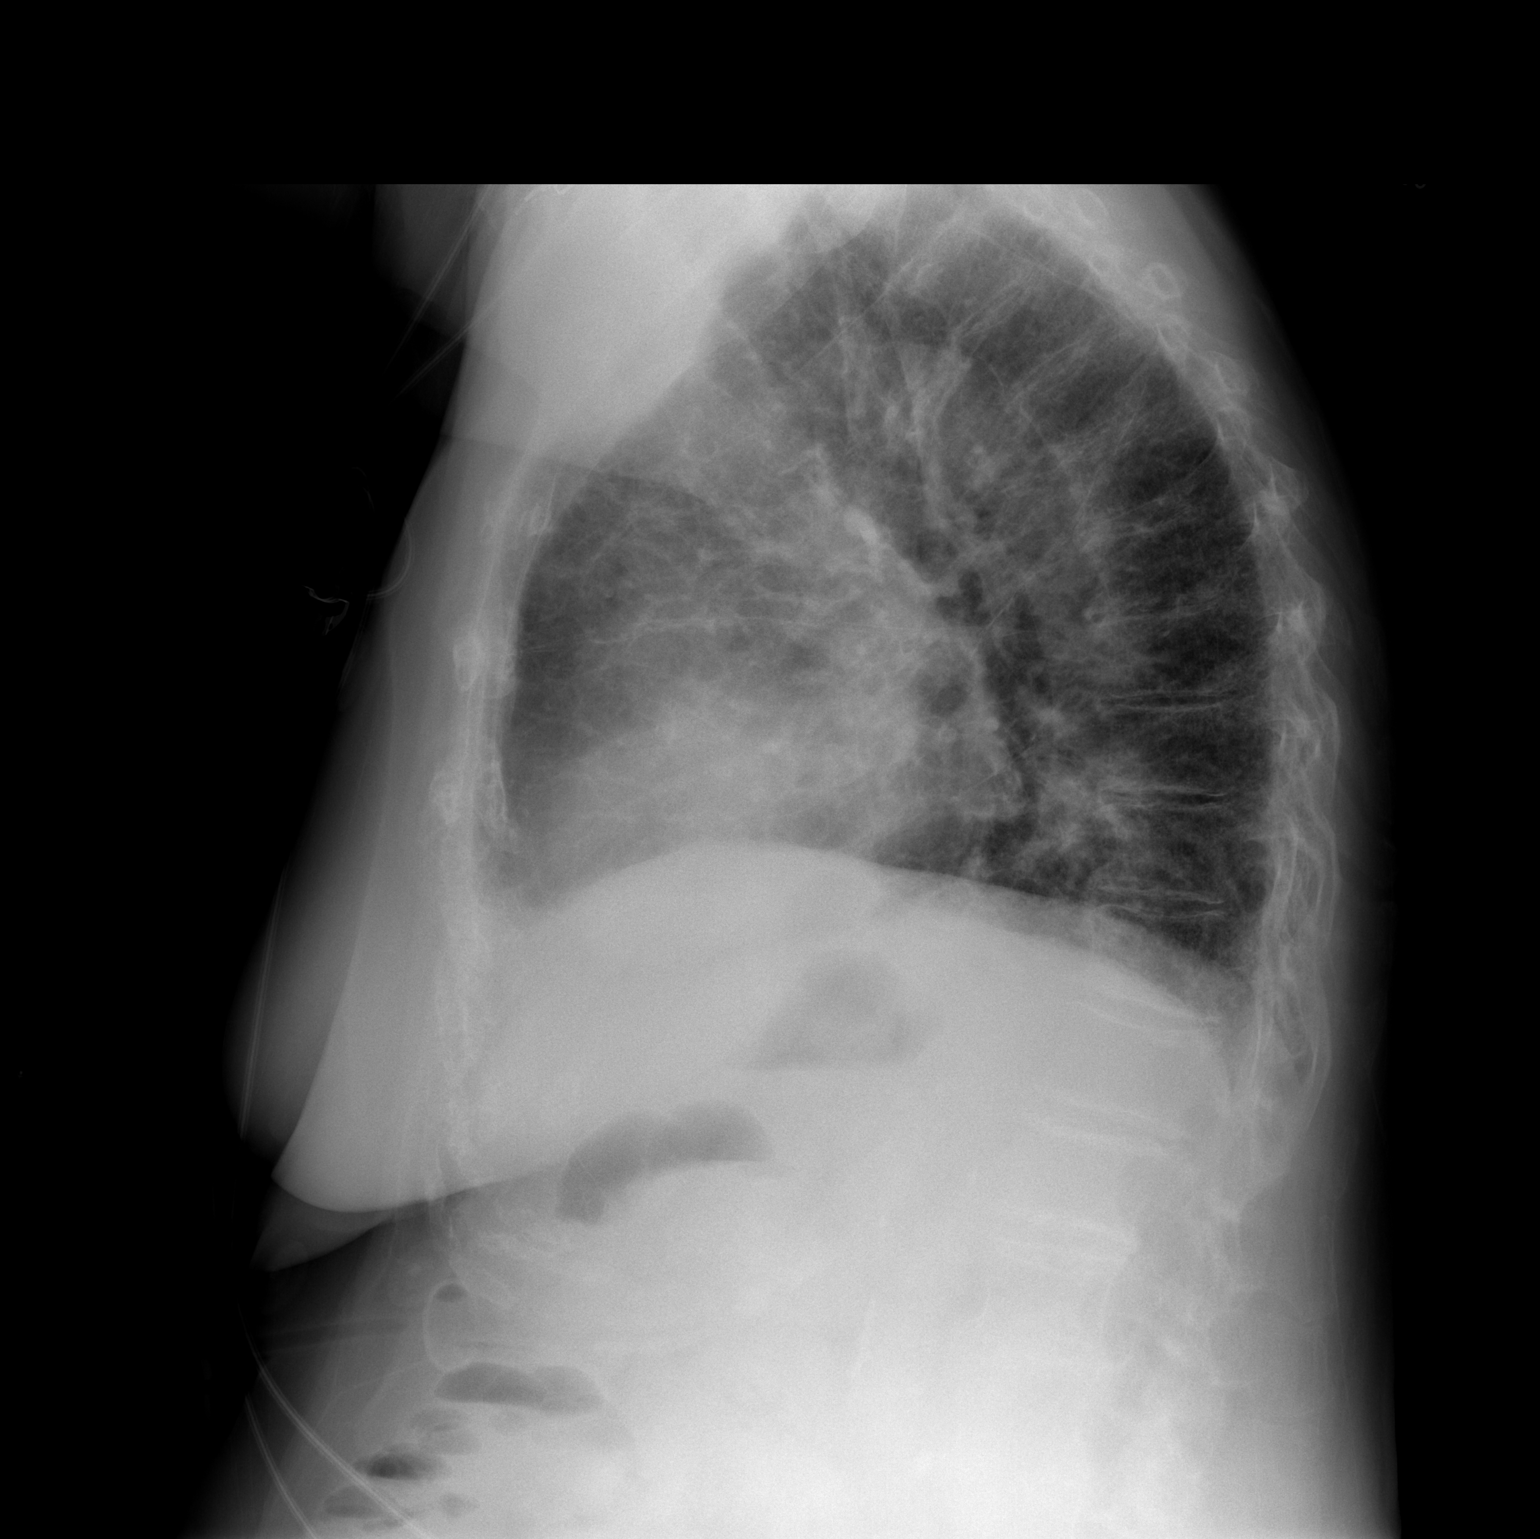

[2 of 2 positions shown; findings below may reference images not displayed]

FINDINGS: The cardiomediastinal silhouette is normal in size. Basal peripheral
predominant subpleural reticular opacities and architectural
distortion is similar to prior study. No pleural effusion or
pneumothorax. No acute osseous abnormality. Unchanged large hiatal
hernia.
IMPRESSION: Stable chronic fibrotic changes.  No active cardiopulmonary disease.

## 2020-02-15 ENCOUNTER — Other Ambulatory Visit: Payer: Self-pay

## 2020-02-15 NOTE — Telephone Encounter (Signed)
ERROR

## 2024-02-07 ENCOUNTER — Other Ambulatory Visit (HOSPITAL_BASED_OUTPATIENT_CLINIC_OR_DEPARTMENT_OTHER): Payer: Self-pay
